# Patient Record
Sex: Female | Born: 1985
Health system: Southern US, Community
[De-identification: ages and names within clinical notes are randomized; demographics above are authoritative.]

## PROBLEM LIST (undated history)

## (undated) ENCOUNTER — Inpatient Hospital Stay (HOSPITAL_COMMUNITY): Payer: Self-pay

## (undated) DIAGNOSIS — I10 Essential (primary) hypertension: Secondary | ICD-10-CM

## (undated) DIAGNOSIS — O139 Gestational [pregnancy-induced] hypertension without significant proteinuria, unspecified trimester: Secondary | ICD-10-CM

---

## 2010-05-17 DIAGNOSIS — O1490 Unspecified pre-eclampsia, unspecified trimester: Secondary | ICD-10-CM

## 2010-05-17 DIAGNOSIS — O4100X0 Oligohydramnios, unspecified trimester, not applicable or unspecified: Secondary | ICD-10-CM

## 2013-07-04 DIAGNOSIS — O1490 Unspecified pre-eclampsia, unspecified trimester: Secondary | ICD-10-CM

## 2016-07-01 ENCOUNTER — Ambulatory Visit (INDEPENDENT_AMBULATORY_CARE_PROVIDER_SITE_OTHER): Payer: BLUE CROSS/BLUE SHIELD | Admitting: Physician Assistant

## 2016-07-01 VITALS — BP 120/78 | HR 91 | Temp 98.4°F | Resp 17 | Ht 64.0 in | Wt 172.0 lb

## 2016-07-01 DIAGNOSIS — R197 Diarrhea, unspecified: Secondary | ICD-10-CM | POA: Diagnosis not present

## 2016-07-01 DIAGNOSIS — R21 Rash and other nonspecific skin eruption: Secondary | ICD-10-CM | POA: Diagnosis not present

## 2016-07-01 LAB — POCT URINALYSIS DIP (MANUAL ENTRY)
Bilirubin, UA: NEGATIVE
Blood, UA: NEGATIVE
Glucose, UA: NEGATIVE mg/dL
Ketones, POC UA: NEGATIVE mg/dL
Leukocytes, UA: NEGATIVE
Nitrite, UA: NEGATIVE
Protein Ur, POC: NEGATIVE mg/dL
Spec Grav, UA: >=1.03 — AB (ref 1.010–1.025)
Urobilinogen, UA: 0.2 E.U./dL
pH, UA: 5.5 (ref 5.0–8.0)

## 2016-07-01 LAB — POCT CBC
Granulocyte percent: 47.5 % (ref 37–80)
HCT, POC: 39.6 % (ref 37.7–47.9)
Hemoglobin: 13.2 g/dL (ref 12.2–16.2)
Lymph, poc: 2.9 (ref 0.6–3.4)
MCH, POC: 27.6 pg (ref 27–31.2)
MCHC: 33.2 g/dL (ref 31.8–35.4)
MCV: 83 fL (ref 80–97)
MID (cbc): 0.6 (ref 0–0.9)
MPV: 7.6 fL (ref 0–99.8)
POC Granulocyte: 3.2 (ref 2–6.9)
POC LYMPH PERCENT: 43.3 % (ref 10–50)
POC MID %: 9.2 %M (ref 0–12)
Platelet Count, POC: 368 10*3/uL (ref 142–424)
RBC: 4.77 M/uL (ref 4.04–5.48)
RDW, POC: 13.2 %
WBC: 6.8 10*3/uL (ref 4.6–10.2)

## 2016-07-01 LAB — POC MICROSCOPIC URINALYSIS (UMFC): Mucus: ABSENT

## 2016-07-01 LAB — POCT URINE PREGNANCY: Preg Test, Ur: NEGATIVE

## 2016-07-01 NOTE — Patient Instructions (Addendum)
It appears from your urine results that you are dehydrated. I recommend drinking at least 64 oz of water daily and perhaps even more since you are having diarrhea. While we are waiting your stool culture results which can take anyone from 2-3 days, I would like you to stick to a bland diet, avoiding bread. You can also try over the counter imodium for your diarrhea. Once we have your results we will contact you. If anything gets worse or does not resolve in 3-4 days, please return. You may need further evaluation by a GI doctor if these symptoms persist as you may have underlying food allergies causing  your symptoms.   Bland Diet A bland diet consists of foods that do not have a lot of fat or fiber. Foods without fat or fiber are easier for the body to digest. They are also less likely to irritate your mouth, throat, stomach, and other parts of your gastrointestinal tract. A bland diet is sometimes called a BRAT diet. What is my plan? Your health care provider or dietitian may recommend specific changes to your diet to prevent and treat your symptoms, such as:  Eating small meals often.  Cooking food until it is soft enough to chew easily.  Chewing your food well.  Drinking fluids slowly.  Not eating foods that are very spicy, sour, or fatty.  Not eating citrus fruits, such as oranges and grapefruit. What do I need to know about this diet?  Eat a variety of foods from the bland diet food list.  Do not follow a bland diet longer than you have to.  Ask your health care provider whether you should take vitamins. What foods can I eat? Grains   Hot cereals, such as cream of wheat. Bread, crackers, or tortillas made from refined white flour. Rice. Vegetables  Canned or cooked vegetables. Mashed or boiled potatoes. Fruits  Bananas. Applesauce. Other types of cooked or canned fruit with the skin and seeds removed, such as canned peaches or pears. Meats and Other Protein Sources  Scrambled  eggs. Creamy peanut butter or other nut butters. Lean, well-cooked meats, such as chicken or fish. Tofu. Soups or broths. Dairy  Low-fat dairy products, such as milk, cottage cheese, or yogurt. Beverages  Water. Herbal tea. Apple juice. Sweets and Desserts  Pudding. Custard. Fruit gelatin. Ice cream. Fats and Oils  Mild salad dressings. Canola or olive oil. The items listed above may not be a complete list of allowed foods or beverages. Contact your dietitian for more options.  What foods are not recommended? Foods and ingredients that are often not recommended include:  Spicy foods, such as hot sauce or salsa.  Fried foods.  Sour foods, such as pickled or fermented foods.  Raw vegetables or fruits, especially citrus or berries.  Caffeinated drinks.  Alcohol.  Strongly flavored seasonings or condiments. The items listed above may not be a complete list of foods and beverages that are not allowed. Contact your dietitian for more information.  This information is not intended to replace advice given to you by your health care provider. Make sure you discuss any questions you have with your health care provider. Document Released: 06/26/2015 Document Revised: 08/10/2015 Document Reviewed: 03/16/2014 Elsevier Interactive Patient Education  2017 Elsevier Inc.  Gluten-Free Diet  The gluten-free diet includes all foods that do not contain gluten. Gluten is a protein that is found in wheat, rye, barley, and some other grains. Following the gluten-free diet is the only treatment for  people with celiac disease. It helps to prevent damage to the intestines and improves or eliminates the symptoms of celiac disease. Following the gluten-free diet requires some planning. It can be challenging at first, but it gets easier with time and practice. There are more gluten-free options available today than ever before. If you need help finding gluten-free foods or if you have questions, talk with your  diet and nutrition specialist (registered dietitian) or your health care provider. What do I need to know about a gluten-free diet?  All fruits, vegetables, and meats are safe to eat and do not contain gluten.  When grocery shopping, start by shopping in the produce, meat, and dairy sections. These sections are more likely to contain gluten-free foods. Then move to the aisles that contain packaged foods if you need to.  Read all food labels. Gluten is often added to foods. Always check the ingredient list and look for warnings, such as "may contain gluten."  Talk with your dietitian or health care provider before taking a gluten-free multivitamin or mineral supplement.  Be aware of gluten-free foods having contact with foods that contain gluten (cross-contamination). This can happen at home and with any processed foods.  Talk with your health care provider or dietitian about how to reduce the risk of cross-contamination in your home.  If you have questions about how a food is processed, ask the manufacturer. What key words help to identify gluten? Foods that list any of these key words on the label usually contain gluten:  Wheat, flour, enriched flour, bromated flour, white flour, durum flour, graham flour, phosphated flour, self-rising flour, semolina, farina, barley (malt), rye, and oats.  Starch, dextrin, modified food starch, or cereal.  Thickening, fillers, or emulsifiers.  Malt flavoring, malt extract, or malt syrup.  Hydrolyzed vegetable protein. In the [place] packaged foods that are gluten-free are required to be labeled "GF." These foods should be easy to identify and are safe to eat. In the [place] food companies are also required to list common food allergens, including wheat, on their labels. Recommended foods Grains   Amaranth, bean flours, 100% buckwheat flour, corn, millet, nut flours or nut meals, GF oats, quinoa, rice, sorghum, teff, rice wafers, pure cornmeal tortillas,  popcorn, and hot cereals made from cornmeal. Hominy, rice, wild rice. Some Asian rice noodles or bean noodles. Arrowroot starch, corn bran, corn flour, corn germ, cornmeal, corn starch, potato flour, potato starch flour, and rice bran. Plain, brown, and sweet rice flours. Rice polish, soy flour, and tapioca starch. Vegetables   All plain fresh, frozen, and canned vegetables. Fruits   All plain fresh, frozen, canned, and dried fruits, and 100% fruit juices. Meats and other protein foods   All fresh beef, pork, poultry, fish, seafood, and eggs. Fish canned in water, oil, brine, or vegetable broth. Plain nuts and seeds, peanut butter. Some lunch meat and some frankfurters. Dried beans, dried peas, and lentils. Dairy   Fresh plain, dry, evaporated, or condensed milk. Cream, butter, sour cream, whipping cream, and most yogurts. Unprocessed cheese, most processed cheeses, some cottage cheese, some cream cheeses. Beverages   Coffee, tea, most herbal teas. Carbonated beverages and some root beers. Wine, sake, and distilled spirits, such as gin, vodka, and whiskey. Most hard ciders. Fats and oils   Butter, margarine, vegetable oil, hydrogenated butter, olive oil, shortening, lard, cream, and some mayonnaise. Some commercial salad dressings. Olives. Sweets and desserts   Sugar, honey, some syrups, molasses, jelly, and jam. Plain hard candy,  marshmallows, and gumdrops. Pure cocoa powder. Plain chocolate. Custard and some pudding mixes. Gelatin desserts, sorbets, frozen ice pops, and sherbet. Cake, cookies, and other desserts prepared with allowed flours. Some commercial ice creams. Cornstarch, tapioca, and rice puddings. Seasoning and other foods   Some canned or frozen soups. Monosodium glutamate (MSG). Cider, rice, and wine vinegar. Baking soda and baking powder. Cream of tartar. Baking and nutritional yeast. Certain soy sauces made without wheat (ask your dietitian about specific brands that are  allowed). Nuts, coconut, and chocolate. Salt, pepper, herbs, spices, flavoring extracts, imitation or artificial flavorings, natural flavorings, and food colorings. Some medicines and supplements. Some lip glosses and other cosmetics. Rice syrups. The items listed may not be a complete list. Talk with your dietitian about what dietary choices are best for you. Foods to avoid Grains   Barley, bran, bulgur, couscous, cracked wheat, [place], farro, graham, malt, matzo, semolina, wheat germ, and all wheat and rye cereals including spelt and kamut. Cereals containing malt as a flavoring, such as rice cereal. Noodles, spaghetti, macaroni, most packaged rice mixes, and all mixes containing wheat, rye, barley, or triticale. Vegetables   Most creamed vegetables and most vegetables canned in sauces. Some commercially prepared vegetables and salads. Fruits   Thickened or prepared fruits and some pie fillings. Some fruit snacks and fruit roll-ups. Meats and other protein foods   Any meat or meat alternative containing wheat, rye, barley, or gluten stabilizers. These are often marinated or packaged meats and lunch meats. Bread-containing products, such as Swiss steak, croquettes, meatballs, and meatloaf. Most tuna canned in vegetable broth and [place] with hydrolyzed vegetable protein (HVP) injected as part of the basting. Seitan. Imitation fish. Eggs in sauces made from ingredients to avoid. Dairy   Commercial chocolate milk drinks and malted milk. Some non-dairy creamers. Any cheese product containing ingredients to avoid. Beverages   Certain cereal beverages. Beer, ale, malted milk, and some root beers. Some hard ciders. Some instant flavored coffees. Some herbal teas made with barley or with barley malt added. Fats and oils   Some commercial salad dressings. Sour cream containing modified food starch. Sweets and desserts   Some toffees. Chocolate-coated nuts (may be rolled in wheat flour) and some  commercial candies and candy bars. Most cakes, cookies, donuts, pastries, and other baked goods. Some commercial ice cream. Ice cream cones. Commercially prepared mixes for cakes, cookies, and other desserts. Bread pudding and other puddings thickened with flour. Products containing brown rice syrup made with barley malt enzyme. Desserts and sweets made with malt flavoring. Seasoning and other foods   Some curry powders, some dry seasoning mixes, some gravy extracts, some meat sauces, some ketchups, some prepared mustards, and horseradish. Certain soy sauces. Malt vinegar. Bouillon and bouillon cubes that contain HVP. Some chip dips, and some chewing gum. Yeast extract. Brewer's yeast. Caramel color. Some medicines and supplements. Some lip glosses and other cosmetics. The items listed may not be a complete list. Talk with your dietitian about what dietary choices are best for you. Summary  Gluten is a protein that is found in wheat, rye, barley, and some other grains. The gluten-free diet includes all foods that do not contain gluten.  If you need help finding gluten-free foods or if you have questions, talk with your diet and nutrition specialist (registered dietitian) or your health care provider.  Read all food labels. Gluten is often added to foods. Always check the ingredient list and look for warnings, such as "may contain gluten."  This information is not intended to replace advice given to you by your health care provider. Make sure you discuss any questions you have with your health care provider. Document Released: 03/04/2005 Document Revised: 12/18/2015 Document Reviewed: 12/18/2015 Elsevier Interactive Patient Education  2017 [employer].     IF you received an x-ray today, you will receive an invoice from [facility]. Please contact [facility] at [phone] with questions or concerns regarding your invoice.   IF you received labwork today, you will receive an  invoice from [facility]. Please contact [facility] at [phone] with questions or concerns regarding your invoice.   Our billing staff will not be able to assist you with questions regarding bills from these companies.  You will be contacted with the lab results as soon as they are available. The fastest way to get your results is to activate your My Chart account. Instructions are located on the last page of this paperwork. If you have not heard from [place] regarding the results in 2 weeks, please contact this office.

## 2016-07-01 NOTE — Progress Notes (Signed)
Katara Griner  MRN: [id number] DOB: 11/22/1985  Subjective:  Meagan Lee is a 31 y.o. female seen in office today for a chief complaint of diarrhea x 10 days. Has anywhere from 1-5 episodes a day. Had two episodes of vomiting yesterday. She has also been getting a skin rash that looks like hives all over her body after she eats bread for the past 10 days. It resolves in about 30 minutes. This has never happened in the past.Denies bloody, mucopurulent stools, abdominal pain, constipation, fever, and chills. Has not tried anything for relief.  Has not been around anyone that has been sick. No recent travel. Has been able to eat and drink over the past 10 days. LMP 06/02/16. Denies smoking and alcohol use.   Review of Systems  Constitutional: Negative for diaphoresis and fatigue.  Eyes: Negative for visual disturbance.  Genitourinary: Negative for dysuria, flank pain, pelvic pain, urgency, vaginal discharge and vaginal pain.  Musculoskeletal: Negative for gait problem.  Allergic/Immunologic: Negative for environmental allergies and food allergies.  Neurological: Positive for headaches. Negative for dizziness, weakness and light-headedness.    There are no active problems to display for this patient.   No current outpatient prescriptions on file prior to visit.   No current facility-administered medications on file prior to visit.     No Known Allergies   Objective:  BP 120/78 (BP Location: Right Arm, Patient Position: Sitting, Cuff Size: Normal)   Pulse 91   Temp 98.4 F (36.9 C) (Oral)   Resp 17   Ht _0  (1.626 m)   Wt 172 lb (78 kg)   LMP 06/02/2016   SpO2 99%   BMI 29.52 kg/m   Physical Exam  Constitutional: She is oriented to person, place, and time and well-developed, well-nourished, and in no distress.  HENT:  Head: Normocephalic and atraumatic.  Mouth/Throat: Uvula is midline, oropharynx is clear and moist and mucous membranes are normal.  Eyes: Conjunctivae are  normal. Pupils are equal, round, and reactive to light.  Neck: Normal range of motion.  Cardiovascular: Normal rate, regular rhythm and normal heart sounds.   Pulmonary/Chest: Effort normal.  Abdominal: Soft. Bowel sounds are normal. She exhibits no distension. There is no tenderness. There is no guarding.  Neurological: She is alert and oriented to person, place, and time. She has normal sensation, normal strength, normal reflexes and intact cranial nerves. Gait normal.  Skin: Skin is warm and dry. No rash noted.  Psychiatric: Affect normal.  Vitals reviewed.  Results for orders placed or performed in visit on 07/01/16 (from the past 24 hour(s))  POCT CBC     Status: None   Collection Time: 07/01/16  4:33 PM  Result Value Ref Range   WBC 6.8 4.6 - 10.2 K/uL   Lymph, poc 2.9 0.6 - 3.4   POC LYMPH PERCENT 43.3 10 - 50 %L   MID (cbc) 0.6 0 - 0.9   POC MID % 9.2 0 - 12 %M   POC Granulocyte 3.2 2 - 6.9   Granulocyte percent 47.5 37 - 80 %G   RBC 4.77 4.04 - 5.48 M/uL   Hemoglobin 13.2 12.2 - 16.2 g/dL   HCT, POC 39.6 37.7 - 47.9 %   MCV 83.0 80 - 97 fL   MCH, POC 27.6 27 - 31.2 pg   MCHC 33.2 31.8 - 35.4 g/dL   RDW, POC 13.2 %   Platelet Count, POC 368 142 - 424 K/uL   MPV 7.6 0 -  99.8 fL  POCT urinalysis dipstick     Status: Abnormal   Collection Time: 07/01/16  4:39 PM  Result Value Ref Range   Color, UA yellow yellow   Clarity, UA cloudy (A) clear   Glucose, UA negative negative mg/dL   Bilirubin, UA negative negative   Ketones, POC UA negative negative mg/dL   Spec Grav, UA >=1.030 (A) 1.010 - 1.025   Blood, UA negative negative   pH, UA 5.5 5.0 - 8.0   Protein Ur, POC negative negative mg/dL   Urobilinogen, UA 0.2 0.2 or 1.0 E.U./dL   Nitrite, UA Negative Negative   Leukocytes, UA Negative Negative  POCT urine pregnancy     Status: None   Collection Time: 07/01/16  4:40 PM  Result Value Ref Range   Preg Test, Ur Negative Negative    Assessment and Plan :  1.  Diarrhea, unspecified type Labs pending. Recommended bland diet and avoiding all gluten as her history is suspicious for a gluten allergy. Also instructed to continue oral hydration as her urine suggests she is slightly dehydrated. Recommended oral hydration with at least 64 oz of fluids daily. Instructed to -Return to clinic if symptoms worsen, do not improve in 3-4, or as needed. If pt returns with same symptoms, consider referral to GI.  - POCT CBC - POCT urinalysis dipstick - POCT Microscopic Urinalysis ([facility]) - POCT urine pregnancy - CMP14+EGFR - Stool culture 2. Rash and nonspecific skin eruption Not present on exam today. Avoid gluten as this appears to be the offending agent. Return if symptoms persist.    Tenna Delaine PA-C  [facility] 07/01/2016 4:45 PM

## 2016-07-02 DIAGNOSIS — R197 Diarrhea, unspecified: Secondary | ICD-10-CM | POA: Diagnosis not present

## 2016-07-02 LAB — CMP14+EGFR
ALT: 23 [IU]/L (ref 0–32)
AST: 23 IU/L (ref 0–40)
Albumin/Globulin Ratio: 1.5 (ref 1.2–2.2)
Albumin: 4.5 g/dL (ref 3.5–5.5)
Alkaline Phosphatase: 60 IU/L (ref 39–117)
BUN/Creatinine Ratio: 33 — ABNORMAL HIGH (ref 9–23)
BUN: 16 mg/dL (ref 6–20)
Bilirubin Total: 0.3 mg/dL (ref 0.0–1.2)
CO2: 23 mmol/L (ref 18–29)
Calcium: 9.7 mg/dL (ref 8.7–10.2)
Chloride: 101 mmol/L (ref 96–106)
Creatinine, Ser: 0.49 mg/dL — ABNORMAL LOW (ref 0.57–1.00)
GFR calc Af Amer: 150 mL/min/{1.73_m2} (ref >59)
GFR calc non Af Amer: 130 mL/min/{1.73_m2} (ref >59)
Globulin, Total: 3 g/dL (ref 1.5–4.5)
Glucose: 95 mg/dL (ref 65–99)
Potassium: 4.5 mmol/L (ref 3.5–5.2)
Sodium: 140 mmol/L (ref 134–144)
Total Protein: 7.5 g/dL (ref 6.0–8.5)

## 2016-07-03 ENCOUNTER — Telehealth: Payer: Self-pay | Admitting: General Practice

## 2016-07-03 NOTE — Telephone Encounter (Signed)
Pt is looking for lab results   Best number [phone]

## 2016-07-05 NOTE — Telephone Encounter (Signed)
Will you please call patient and let her know that I was waiting for all of her labs to result before I call and discuss them with her. Her stool culture results are still pending. So far her electrolytes, blood sugar, and liver enzymes were all clinically normal. Her kidney function was slightly lower than normal but that is likely due to her dehydrated state. We can repeat this lab in 4-6 weeks. Make sure she is still consuming at least 64 oz of water a day. If her symptoms are still occurring, please return to office visit sooner for further evaluation.

## 2016-07-05 NOTE — Telephone Encounter (Signed)
Please see abn result and advise

## 2016-07-06 LAB — STOOL CULTURE: E coli, Shiga toxin Assay: NEGATIVE

## 2016-07-06 NOTE — Telephone Encounter (Signed)
Pt advised.

## 2017-01-02 ENCOUNTER — Encounter: Payer: Self-pay | Admitting: Family Medicine

## 2017-01-02 ENCOUNTER — Ambulatory Visit (INDEPENDENT_AMBULATORY_CARE_PROVIDER_SITE_OTHER): Payer: BLUE CROSS/BLUE SHIELD | Admitting: Family Medicine

## 2017-01-02 VITALS — BP 110/80 | HR 91 | Temp 98.3°F | Resp 18 | Ht 64.25 in | Wt 169.6 lb

## 2017-01-02 DIAGNOSIS — N644 Mastodynia: Secondary | ICD-10-CM

## 2017-01-02 NOTE — Progress Notes (Signed)
  Chief Complaint  Patient presents with  . right breast pain    x 1 month, not taking any otc medication for pain.  Pain level 5/10    HPI   Breast pain on the right for one month She has a history of pain like this 10 years ago  She reports that the pain is at the top of her breast below the clavicle She does not drink a lot of caffeine Patient's last menstrual period was 11/25/2016. She denies possibility of pregnancy She denies any trauma to the breast There is no nipple discharge  History reviewed. No pertinent past medical history.  No current outpatient prescriptions on file.   No current facility-administered medications for this visit.     Allergies: No Known Allergies  Past Surgical History:  Procedure Laterality Date  . CESAREAN SECTION      Social History   Social History  . Marital status: Married    Spouse name: N/A  . Number of children: N/A  . Years of education: N/A   Social History Main Topics  . Smoking status: Never Smoker  . Smokeless tobacco: Never Used  . Alcohol use No  . Drug use: No  . Sexual activity: Not Asked   Other Topics Concern  . None   Social History Narrative  . None    Review of Systems  Constitutional: Negative for chills, fever, malaise/fatigue and weight loss.  Cardiovascular: Negative for chest pain and palpitations.  Musculoskeletal: Negative for joint pain and neck pain.  Skin: Negative for itching and rash.  Endo/Heme/Allergies: Does not bruise/bleed easily.  Psychiatric/Behavioral: The patient is not nervous/anxious.     Objective: Vitals:   01/02/17 1331  BP: 110/80  Pulse: 91  Resp: 18  Temp: 98.3 F (36.8 C)  TempSrc: Oral  SpO2: 97%  Weight: 169 lb 9.6 oz (76.9 kg)  Height: 5' 4.25" (1.632 m)    Physical Exam  Constitutional: She appears well-developed and well-nourished.  Eyes: Conjunctivae and EOM are normal.  Cardiovascular: Normal rate, regular rhythm and normal heart sounds.     Pulmonary/Chest: Effort normal and breath sounds normal. No respiratory distress. She has no wheezes. Right breast exhibits no inverted nipple, no mass, no nipple discharge, no skin change and no tenderness. Left breast exhibits no inverted nipple, no mass, no nipple discharge, no skin change and no tenderness. Breasts are symmetrical. There is no breast swelling.    Genitourinary: No breast tenderness, discharge or bleeding.    Chaperone present for breast exam  Assessment and Plan Cindee was seen today for right breast pain.  Diagnoses and all orders for this visit:  Breast pain, right -     MM Digital Diagnostic Unilat R; Future Will send for breast exam with Ultrasound    Courtni Balash A Creta LevinStallings

## 2017-01-02 NOTE — Patient Instructions (Addendum)
     IF you received an x-ray today, you will receive an invoice from [facility]. Please contact [facility] at [phone] with questions or concerns regarding your invoice.   IF you received labwork today, you will receive an invoice from [facility]. Please contact [facility] at [phone] with questions or concerns regarding your invoice.   Our billing staff will not be able to assist you with questions regarding bills from these companies.  You will be contacted with the lab results as soon as they are available. The fastest way to get your results is to activate your My Chart account. Instructions are located on the last page of this paperwork. If you have not heard from us regarding the results in 2 weeks, please contact this office.     Mammogram A mammogram is an X-ray of the breasts that is done to check for changes that are not normal. This test can screen for and find any changes that may suggest breast cancer. This test can also help to find other changes and variations in the breast. What happens before the procedure?  Have this test done about 1-2 weeks after your period. This is usually when your breasts are the least tender.  If you are visiting a new doctor or clinic, send any past mammogram images to your new doctor's office.  Wash your breasts and under your arms the day of the test.  Do not use deodorants, perfumes, lotions, or powders on the day of the test.  Take off any jewelry from your neck.  Wear clothes that you can change into and out of easily. What happens during the procedure?  You will undress from the waist up. You will put on a gown.  You will stand in front of the X-ray machine.  Each breast will be placed between two plastic or glass plates. The plates will press down on your breast for a few seconds. Try to stay as relaxed as possible. This does not cause any harm to your breasts. Any discomfort you feel will be very  brief.  X-rays will be taken from different angles of each breast. The procedure may vary among doctors and hospitals. What happens after the procedure?  The mammogram will be looked at by a specialist (radiologist).  You may need to do certain parts of the test again. This depends on the quality of the images.  Ask when your test results will be ready. Make sure you get your test results.  You may go back to your normal activities. This information is not intended to replace advice given to you by your health care provider. Make sure you discuss any questions you have with your health care provider. Document Released: 05/31/2008 Document Revised: 08/10/2015 Document Reviewed: 05/13/2014 Elsevier Interactive Patient Education  [employer]2018 Else[employer] Inc.

## 2017-01-24 DIAGNOSIS — Z30431 Encounter for routine checking of intrauterine contraceptive device: Secondary | ICD-10-CM | POA: Diagnosis not present

## 2017-02-17 DIAGNOSIS — Z113 Encounter for screening for infections with a predominantly sexual mode of transmission: Secondary | ICD-10-CM | POA: Diagnosis not present

## 2017-02-17 DIAGNOSIS — Z30431 Encounter for routine checking of intrauterine contraceptive device: Secondary | ICD-10-CM | POA: Diagnosis not present

## 2017-02-17 DIAGNOSIS — Z30011 Encounter for initial prescription of contraceptive pills: Secondary | ICD-10-CM | POA: Diagnosis not present

## 2017-03-25 ENCOUNTER — Encounter (HOSPITAL_COMMUNITY): Payer: Self-pay | Admitting: Emergency Medicine

## 2017-03-25 ENCOUNTER — Emergency Department (HOSPITAL_COMMUNITY)
Admission: EM | Admit: 2017-03-25 | Discharge: 2017-03-25 | Disposition: A | Discharge status: Home / Self Care | Payer: BLUE CROSS/BLUE SHIELD | Attending: Emergency Medicine | Admitting: Emergency Medicine

## 2017-03-25 ENCOUNTER — Emergency Department (HOSPITAL_COMMUNITY): Payer: BLUE CROSS/BLUE SHIELD

## 2017-03-25 DIAGNOSIS — J189 Pneumonia, unspecified organism: Secondary | ICD-10-CM | POA: Diagnosis not present

## 2017-03-25 DIAGNOSIS — R1084 Generalized abdominal pain: Secondary | ICD-10-CM | POA: Diagnosis not present

## 2017-03-25 DIAGNOSIS — J181 Lobar pneumonia, unspecified organism: Secondary | ICD-10-CM | POA: Insufficient documentation

## 2017-03-25 DIAGNOSIS — R109 Unspecified abdominal pain: Secondary | ICD-10-CM | POA: Diagnosis not present

## 2017-03-25 DIAGNOSIS — R110 Nausea: Secondary | ICD-10-CM | POA: Insufficient documentation

## 2017-03-25 LAB — URINALYSIS, ROUTINE W REFLEX MICROSCOPIC
Bacteria, UA: NONE SEEN
Bilirubin Urine: NEGATIVE
Glucose, UA: NEGATIVE mg/dL
Ketones, ur: NEGATIVE mg/dL
Leukocytes, UA: NEGATIVE
Nitrite: NEGATIVE
Protein, ur: NEGATIVE mg/dL
Specific Gravity, Urine: 1.012 (ref 1.005–1.030)
pH: 5 (ref 5.0–8.0)

## 2017-03-25 LAB — CBC WITH DIFFERENTIAL/PLATELET
Basophils Absolute: 0 10*3/uL (ref 0.0–0.1)
Basophils Relative: 0 %
Eosinophils Absolute: 0.3 10*3/uL (ref 0.0–0.7)
Eosinophils Relative: 3 %
HCT: 38.6 % (ref 36.0–46.0)
Hemoglobin: 12.6 g/dL (ref 12.0–15.0)
Lymphocytes Relative: 41 %
Lymphs Abs: 3.6 10*3/uL (ref 0.7–4.0)
MCH: 27.6 pg (ref 26.0–34.0)
MCHC: 32.6 g/dL (ref 30.0–36.0)
MCV: 84.6 fL (ref 78.0–100.0)
Monocytes Absolute: 0.8 10*3/uL (ref 0.1–1.0)
Monocytes Relative: 9 %
Neutro Abs: 4.1 10*3/uL (ref 1.7–7.7)
Neutrophils Relative %: 47 %
Platelets: 301 10*3/uL (ref 150–400)
RBC: 4.56 MIL/uL (ref 3.87–5.11)
RDW: 12.9 % (ref 11.5–15.5)
WBC: 8.7 10*3/uL (ref 4.0–10.5)

## 2017-03-25 LAB — COMPREHENSIVE METABOLIC PANEL
ALT: 20 U/L (ref 14–54)
AST: 21 U/L (ref 15–41)
Albumin: 3.7 g/dL (ref 3.5–5.0)
Alkaline Phosphatase: 56 U/L (ref 38–126)
Anion gap: 9 (ref 5–15)
BUN: 13 mg/dL (ref 6–20)
CO2: 20 mmol/L — ABNORMAL LOW (ref 22–32)
Calcium: 9.2 mg/dL (ref 8.9–10.3)
Chloride: 107 mmol/L (ref 101–111)
Creatinine, Ser: 0.49 mg/dL (ref 0.44–1.00)
GFR calc Af Amer: >60 mL/min (ref >60)
GFR calc non Af Amer: >60 mL/min (ref >60)
Glucose, Bld: 112 mg/dL — ABNORMAL HIGH (ref 65–99)
Potassium: 3.8 mmol/L (ref 3.5–5.1)
Sodium: 136 mmol/L (ref 135–145)
Total Bilirubin: 0.9 mg/dL (ref 0.3–1.2)
Total Protein: 7.2 g/dL (ref 6.5–8.1)

## 2017-03-25 LAB — I-STAT BETA HCG BLOOD, ED (MC, WL, AP ONLY): I-stat hCG, quantitative: <5 m[IU]/mL (ref <5)

## 2017-03-25 MED ORDER — MORPHINE SULFATE (PF) 4 MG/ML IV SOLN
4.0000 mg | Freq: Once | INTRAVENOUS | Status: AC
  Administered 2017-03-25: 4 mg via INTRAVENOUS
  Filled 2017-03-25: qty 1

## 2017-03-25 MED ORDER — KETOROLAC TROMETHAMINE 30 MG/ML IJ SOLN
30.0000 mg | Freq: Once | INTRAMUSCULAR | Status: AC
  Administered 2017-03-25: 30 mg via INTRAVENOUS
  Filled 2017-03-25: qty 1

## 2017-03-25 MED ORDER — ONDANSETRON HCL 4 MG/2ML IJ SOLN
4.0000 mg | Freq: Once | INTRAMUSCULAR | Status: AC
  Administered 2017-03-25: 4 mg via INTRAVENOUS
  Filled 2017-03-25: qty 2

## 2017-03-25 MED ORDER — OXYCODONE-ACETAMINOPHEN 5-325 MG PO TABS
1.0000 | ORAL_TABLET | Freq: Once | ORAL | Status: AC
  Administered 2017-03-25: 1 via ORAL
  Filled 2017-03-25: qty 1

## 2017-03-25 MED ORDER — AZITHROMYCIN 250 MG PO TABS
500.0000 mg | ORAL_TABLET | Freq: Once | ORAL | Status: AC
  Administered 2017-03-25: 500 mg via ORAL
  Filled 2017-03-25: qty 2

## 2017-03-25 MED ORDER — AZITHROMYCIN 250 MG PO TABS: 250.0000 mg | ORAL_TABLET | Freq: Every day | ORAL | 0 refills | Status: AC

## 2017-03-25 NOTE — ED Provider Notes (Signed)
[facility] Provider Note   CSN: [id number] Arrival date & time: 03/25/17  0051     History   Chief Complaint Chief Complaint  Patient presents with  . Flank Pain    HPI Meagan Lee is a 32 y.o. female presents for left flank pain since last night. Described as sharp, nonradiating, constant. Worse with and palpation. Has taken ibuprofen with mild relief that lasted 2 hours. Associated symptoms include nausea. Denies fevers, chills, chest pain, shortness of breath, abdominal pain, dysuria, hematuria, abnormal vaginal discharge. Last menstrual period began yesterday. No trauma, falls or exertional activity preceding symptoms. No history of kidney stones. Has been urinating without difficulty.   HPI  History reviewed. No pertinent past medical history.  There are no active problems to display for this patient.   Past Surgical History:  Procedure Laterality Date  . CESAREAN SECTION      OB History    No data available       Home Medications    Prior to Admission medications   Medication Sig Start Date End Date Taking? Authorizing Provider  ibuprofen (ADVIL,MOTRIN) 200 MG tablet Take 200 mg by mouth every 6 (six) hours as needed for moderate pain.   Yes [provider]  azithromycin (ZITHROMAX Z-PAK) 250 MG tablet Take 1 tablet (250 mg total) by mouth daily for 4 doses. 03/25/17 03/29/17  Liberty HandyGibbons, Nico Syme J, PA-C    Family History Family History  Problem Relation Age of Onset  . Hypertension Maternal Grandmother   . Asthma Maternal Grandfather     Social History Social History   Tobacco Use  . Smoking status: Never Smoker  . Smokeless tobacco: Never Used  Substance Use Topics  . Alcohol use: No  . Drug use: No     Allergies   Patient has no known allergies.   Review of Systems Review of Systems  Gastrointestinal: Positive for nausea.  Genitourinary: Positive for flank pain.  All other systems reviewed and  are negative.    Physical Exam Updated Vital Signs BP 104/60   Pulse 80   Temp 97.7 F (36.5 C) (Oral)   Resp 18   LMP 03/24/2017   SpO2 97%   Physical Exam  Constitutional: She is oriented to person, place, and time. She appears well-developed and well-nourished. No distress.  NAD. Nontoxic, looks uncomfortable.  HENT:  Head: Normocephalic and atraumatic.  Right Ear: External ear normal.  Left Ear: External ear normal.  Nose: Nose normal.  Moist mucous membranes.  Eyes: Conjunctivae and EOM are normal. No scleral icterus.  Neck: Normal range of motion.  Cardiovascular: Normal rate, regular rhythm and normal heart sounds.  No murmur heard. Pulmonary/Chest: Effort normal and breath sounds normal. She has no wheezes.  Abdominal: Soft. Bowel sounds are normal. There is tenderness.  Right CVA tenderness. No overlying rashes or ecchymosis. No midline CTL spine tenderness. Abdomen otherwise soft, nontender, nondistended. No suprapubic tenderness.  Musculoskeletal: Normal range of motion. She exhibits no deformity.  Neurological: She is alert and oriented to person, place, and time.  Skin: Skin is warm and dry. Capillary refill takes less than 2 seconds.  Psychiatric: She has a normal mood and affect. Her behavior is normal. Judgment and thought content normal.  Nursing note and vitals reviewed.    ED Treatments / Results  Labs (all labs ordered are listed, but only abnormal results are displayed) Labs Reviewed  COMPREHENSIVE METABOLIC PANEL - Abnormal; Notable for the following components:  Result Value   CO2 20 (*)    Glucose, Bld 112 (*)    All other components within normal limits  URINALYSIS, ROUTINE W REFLEX MICROSCOPIC - Abnormal; Notable for the following components:   Hgb urine dipstick MODERATE (*)    Squamous Epithelial / LPF 0-5 (*)    All other components within normal limits  URINE CULTURE  CBC WITH DIFFERENTIAL/PLATELET  I-STAT BETA HCG BLOOD, ED ([facility],  [facility], [facility] ONLY)    EKG  EKG Interpretation None       Radiology Dg Chest 2 View  Result Date: 03/25/2017 CLINICAL DATA:  Flank pain since yesterday. EXAM: CHEST  2 VIEW COMPARISON:  CT scan 03/25/2017 FINDINGS: The cardiac silhouette, mediastinal and hilar contours are normal. Streaky subsegmental bibasilar atelectasis but no infiltrates, edema or effusions. The bony thorax is intact. IMPRESSION: Streaky bibasilar atelectasis but no infiltrates or effusions. Electronically Signed   By: Rudie Meyer M.D.   On: 03/25/2017 11:35   Ct Renal Stone Study  Result Date: 03/25/2017 CLINICAL DATA:  Left flank pain for 3 days EXAM: CT ABDOMEN AND PELVIS WITHOUT CONTRAST TECHNIQUE: Multidetector CT imaging of the abdomen and pelvis was performed following the standard protocol without oral or intravenous contrast material administration. COMPARISON:  None. FINDINGS: Lower chest: There is patchy airspace opacity in the posterior lung bases consistent with atelectasis and likely superimposed bibasilar pneumonia. Hepatobiliary: No focal liver lesions are apparent on this noncontrast enhanced study. Gallbladder wall is not appreciably thickened. There is no biliary duct dilatation. Pancreas: There is no apparent mass or inflammatory focus in the pancreas. Spleen: No splenic lesions are evident. A small splenule is noted medial to the spleen somewhat posteriorly. Adrenals/Urinary Tract: Adrenals bilaterally appear normal. Kidneys bilaterally show no evident mass or hydronephrosis on either side. There is no evident renal or ureteral calculus on either side. Urinary bladder is midline with wall thickness within normal limits. Stomach/Bowel: There is stool present throughout much of the cecum and proximal at ascending colon. There is no bowel wall or mesenteric thickening. There is no appreciable bowel obstruction. No free air or portal venous air. Vascular/Lymphatic: There is no abdominal aortic aneurysm. No vascular  lesions are evident. There is a retroaortic left renal vein, an anatomic variant. There is no appreciable adenopathy in the abdomen or pelvis. Reproductive: Uterus is in the midline. No intrauterine mass is appreciable. There is a fat containing structure arising in the left ovary measuring 1.0 x 1.0 cm, a probable small dermoid. No other pelvic mass evident. Other: Appendix appears normal. No abscess or ascites evident in the abdomen or pelvis. There is a rather minimal ventral hernia containing only fat. Musculoskeletal: There is lumbar dextroscoliosis. There are no blastic or lytic bone lesions. There is no intramuscular or abdominal wall lesion. IMPRESSION: 1.  Apparent pneumonia in the posterior lung bases bilaterally. 2.   There is no renal or ureteral calculus.  No hydronephrosis. 3. No bowel wall thickening or bowel obstruction. No evident abscess. Appendix region appears normal. 4.  Small left ovarian dermoid. 5.  Rather minimal ventral hernia containing only fat. Electronically Signed   By: Bretta Bang III M.D.   On: 03/25/2017 10:38    Procedures Procedures (including critical care time)  Medications Ordered in ED Medications  azithromycin (ZITHROMAX) tablet 500 mg (not administered)  oxyCODONE-acetaminophen (PERCOCET/ROXICET) 5-325 MG per tablet 1 tablet (1 tablet Oral Given 03/25/17 0132)  morphine 4 MG/ML injection 4 mg (4 mg Intravenous Given 03/25/17 0947)  ondansetron [facility]) injection 4 mg (4 mg Intravenous Given 03/25/17 0946)  ketorolac (TORADOL) 30 MG/ML injection 30 mg (30 mg Intravenous Given 03/25/17 1116)  morphine 4 MG/ML injection 4 mg (4 mg Intravenous Given 03/25/17 1116)     Initial Impression / Assessment and Plan / ED Course  I have reviewed the triage vital signs and the nursing notes.  Pertinent labs & imaging results that were available during my care of the patient were reviewed by me and considered in my medical decision making (see chart for  details).  Clinical Course as of Mar 25 1212  Tue Mar 25, 2017  1058 IMPRESSION: 1. Apparent pneumonia in the posterior lung bases bilaterally. 2.  There is no renal or ureteral calculus. No hydronephrosis. 3. No bowel wall thickening or bowel obstruction. No evident abscess. Appendix region appears normal. 4. Small left ovarian dermoid. 5. Rather minimal ventral hernia containing only fat. CT Renal Stone Study [CG]  1107 Reevaluated patient. Discussed results of CT renal study. On repeat exam, she still has reproducible left flank pain that goes up into left posterior chest wall. States that she had a cough recently after using Clorox to clean her house but otherwise denies cough, fevers or chest pains.  [CG]  1203 FINDINGS: The cardiac silhouette, mediastinal and hilar contours are normal. Streaky subsegmental bibasilar atelectasis but no infiltrates, edema or effusions. The bony thorax is intact.  [CG]    Clinical Course User Index [CG] Liberty Handy, PA-C   32 year old healthy female presents with sudden onset left flank pain. Worse with movement and palpation.  On exam, nontoxic. Afebrile. She has right CVA tenderness. No abdominal tenderness. No rashes. We'll evaluate for kidney stone. Lower suspicion for diverticulitis, dissection. History and exam not consistent with cholecystitis, appendicitis, POA.  Lab work unremarkable. Moderate hemoglobin and urine however she is currently on her menses. She does not have any dysuria. CT renal stone pending.   Final Clinical Impressions(s) / ED Diagnoses   CT negative for kidney stone, hydronephrosis but noted bilateral infiltrates in the lower lobes bilaterally. This could explain left-sided flank/posterior chest wall pain. Patient has remained afebrile, without hypoxia, tachypnea or tachycardia. She is PERC negative. She is ambulatory without desaturation. No significant immunosuppression. We'll treat for community-acquired  pneumonia with Zithromax.  Final diagnoses:  Community acquired pneumonia of left lower lobe of lung [facility])    ED Discharge Orders        Ordered    azithromycin (ZITHROMAX Z-PAK) 250 MG tablet  Daily     03/25/17 1214       Liberty Handy, [place] 03/25/17 1214    Margarita Grizzle, MD 03/26/17 [phone]

## 2017-03-25 NOTE — ED Notes (Signed)
Patient transported to CT 

## 2017-03-25 NOTE — ED Notes (Signed)
Patient ambulated in the hallway with no complications.  Patients 02 saturation remained at 96% while ambulating.

## 2017-03-25 NOTE — Discharge Instructions (Signed)
CT scan showed pneumonia in both of your lower lungs. There is no kidney stone. Other lab work was normal.  Please take antibiotic as prescribed for the next 4 days. Take Tylenol or ibuprofen for pain.  You can take as much as 1000 mg tylenol every 8 hours. If pain persists, take 600 mg ibuprofen every 8 hours.  Return to ED for shortness of breath, chest pain, abdominal pain, urinary symptoms.  Contact [facility] to establish care with a primary care provider for regular, routine medical care.  This clinic accepts patients without medical insurance. A primary care provider can perform physical exams, adjust your daily medications and give you refills.

## 2017-03-25 NOTE — ED Triage Notes (Signed)
Patient reports left flank pain onset yesterday , denies injury , no hematuria or dysuria .

## 2017-03-26 ENCOUNTER — Emergency Department (HOSPITAL_COMMUNITY)
Admission: EM | Admit: 2017-03-26 | Discharge: 2017-03-26 | Discharge status: Against Medical Advice | Payer: BLUE CROSS/BLUE SHIELD

## 2017-03-26 ENCOUNTER — Ambulatory Visit (HOSPITAL_COMMUNITY)
Admission: EM | Admit: 2017-03-26 | Discharge: 2017-03-26 | Disposition: A | Discharge status: Home / Self Care | Payer: BLUE CROSS/BLUE SHIELD

## 2017-03-26 NOTE — ED Notes (Signed)
Patient complains of left flank pain, firmly denies this is pain from pneumonia.  Discussed what tests available here versus emergency department.  Discussed with dr hagler.  Patient and family decided to go to emergency department

## 2017-03-26 NOTE — ED Notes (Signed)
Unable to locate pt. at waiting area .  

## 2017-03-26 NOTE — ED Notes (Signed)
Pt not answering for triage, presumed to have left without being seen.

## 2017-03-27 ENCOUNTER — Other Ambulatory Visit: Payer: Self-pay

## 2017-03-27 ENCOUNTER — Encounter: Payer: Self-pay | Admitting: Family Medicine

## 2017-03-27 ENCOUNTER — Ambulatory Visit: Payer: BLUE CROSS/BLUE SHIELD | Admitting: Family Medicine

## 2017-03-27 VITALS — BP 110/74 | HR 98 | Temp 97.9°F | Ht 64.17 in | Wt 170.0 lb

## 2017-03-27 DIAGNOSIS — M545 Low back pain, unspecified: Secondary | ICD-10-CM

## 2017-03-27 DIAGNOSIS — R109 Unspecified abdominal pain: Secondary | ICD-10-CM | POA: Diagnosis not present

## 2017-03-27 DIAGNOSIS — K5900 Constipation, unspecified: Secondary | ICD-10-CM

## 2017-03-27 LAB — POCT URINALYSIS DIP (MANUAL ENTRY)
Glucose, UA: NEGATIVE mg/dL
Ketones, POC UA: NEGATIVE mg/dL
Leukocytes, UA: NEGATIVE
Nitrite, UA: NEGATIVE
Spec Grav, UA: >=1.03 — AB (ref 1.010–1.025)
Urobilinogen, UA: 0.2 E.U./dL
pH, UA: 5.5 (ref 5.0–8.0)

## 2017-03-27 LAB — URINE CULTURE: Culture: NO GROWTH

## 2017-03-27 MED ORDER — CYCLOBENZAPRINE HCL 10 MG PO TABS: 10.0000 mg | ORAL_TABLET | Freq: Three times a day (TID) | ORAL | 0 refills | Status: DC | PRN

## 2017-03-27 MED ORDER — POLYETHYLENE GLYCOL 3350 17 GM/SCOOP PO POWD: 17.0000 g | Freq: Every day | ORAL | 1 refills | Status: DC

## 2017-03-27 NOTE — Patient Instructions (Addendum)
     IF you received an x-ray today, you will receive an invoice from [facility]. Please contact [facility] at [phone] with questions or concerns regarding your invoice.   IF you received labwork today, you will receive an invoice from [facility]. Please contact [facility] at [phone] with questions or concerns regarding your invoice.   Our billing staff will not be able to assist you with questions regarding bills from these companies.  You will be contacted with the lab results as soon as they are available. The fastest way to get your results is to activate your My Chart account. Instructions are located on the last page of this paperwork. If you have not heard from us regarding the results in 2 weeks, please contact this office.     Constipation, Adult Constipation is when a person:  Poops (has a bowel movement) fewer times in a week than normal.  Has a hard time pooping.  Has poop that is dry, hard, or bigger than normal.  Follow these instructions at home: Eating and drinking   Eat foods that have a lot of fiber, such as: ? Fresh fruits and vegetables. ? Whole grains. ? Beans.  Eat less of foods that are high in fat, low in fiber, or overly processed, such as: ? French fries. ? Hamburgers. ? Cookies. ? Candy. ? Soda.  Drink enough fluid to keep your pee (urine) clear or pale yellow. General instructions  Exercise regularly or as told by your doctor.  Go to the restroom when you feel like you need to poop. Do not hold it in.  Take over-the-counter and prescription medicines only as told by your doctor. These include any fiber supplements.  Do pelvic floor retraining exercises, such as: ? Doing deep breathing while relaxing your lower belly (abdomen). ? Relaxing your pelvic floor while pooping.  Watch your condition for any changes.  Keep all follow-up visits as told by your doctor. This is important. Contact a doctor if:  You have  pain that gets worse.  You have a fever.  You have not pooped for 4 days.  You throw up (vomit).  You are not hungry.  You lose weight.  You are bleeding from the anus.  You have thin, pencil-like poop (stool). Get help right away if:  You have a fever, and your symptoms suddenly get worse.  You leak poop or have blood in your poop.  Your belly feels hard or bigger than normal (is bloated).  You have very bad belly pain.  You feel dizzy or you faint. This information is not intended to replace advice given to you by your health care provider. Make sure you discuss any questions you have with your health care provider. Document Released: 08/21/2007 Document Revised: 09/22/2015 Document Reviewed: 08/23/2015 Elsevier Interactive Patient Education  2018 Elsevier Inc.  

## 2017-03-27 NOTE — Progress Notes (Signed)
1/10/20193:36 PM  Meagan Lee 03-11-1986, 32 y.o. female [id number]  Chief Complaint  Patient presents with  . Pneumonia    ER VISIT YESTERDAY  . Back Pain    STOMACH PAIN FOR 5 DAYS    HPI:   Patient is a 32 y.o. female who presents today for ER followup.   Seen yesterday. Dx with CAP. Started azithromycin. Denies fevers, SOB. + cough and fatigue  She also has been having lower abd pain, mild distention, no nausea, no vomiting, reports last BM about 5 days ago. Is passing gas.  She also reports left lower abd pain, spasms, does not radiate. Had ct scan for renal stones done in ER which was negative. Denies any inciting event or recent trauma. Pain worse with movement.   She is currently on her period  Depression screen [facility] 2/9 03/27/2017 01/02/2017 07/01/2016  Decreased Interest 0 0 0  Down, Depressed, Hopeless 0 0 0  PHQ - 2 Score 0 0 0    No Known Allergies  Prior to Admission medications   Medication Sig Start Date End Date Taking? Authorizing Provider  azithromycin (ZITHROMAX Z-PAK) 250 MG tablet Take 1 tablet (250 mg total) by mouth daily for 4 doses. 03/25/17 03/29/17 Yes Liberty Handy, PA-C  ibuprofen (ADVIL,MOTRIN) 200 MG tablet Take 200 mg by mouth every 6 (six) hours as needed for moderate pain.   Yes [provider]    History reviewed. No pertinent past medical history.  Past Surgical History:  Procedure Laterality Date  . CESAREAN SECTION      Social History   Tobacco Use  . Smoking status: Never Smoker  . Smokeless tobacco: Never Used  Substance Use Topics  . Alcohol use: No    Family History  Problem Relation Age of Onset  . Hypertension Maternal Grandmother   . Asthma Maternal Grandfather     ROS Per hpi  OBJECTIVE:  Blood pressure 110/74, pulse 98, temperature 97.9 F (36.6 C), temperature source Oral, height 5' 4.17" (1.63 m), weight 170 lb (77.1 kg), last menstrual period 03/24/2017, SpO2 98 %.  Physical Exam    Constitutional: She is oriented to person, place, and time and well-developed, well-nourished, and in no distress.  HENT:  Head: Normocephalic and atraumatic.  Mouth/Throat: Oropharynx is clear and moist. No oropharyngeal exudate.  Eyes: EOM are normal. Pupils are equal, round, and reactive to light. No scleral icterus.  Neck: Neck supple.  Cardiovascular: Normal rate, regular rhythm and normal heart sounds. Exam reveals no gallop and no friction rub.  No murmur heard. Pulmonary/Chest: Effort normal and breath sounds normal. She has no wheezes. She has no rales.  Abdominal: Soft. Bowel sounds are normal. She exhibits no distension. There is no hepatosplenomegaly. There is tenderness in the left upper quadrant and left lower quadrant. There is no rebound, no guarding and no CVA tenderness.  Musculoskeletal: She exhibits no edema.       Lumbar back: She exhibits tenderness and spasm. She exhibits normal range of motion and no bony tenderness.  Neurological: She is alert and oriented to person, place, and time. She has normal strength and normal reflexes. She has a normal Straight Leg Raise Test. Gait normal.  Skin: Skin is warm and dry.     Results for orders placed or performed in visit on 03/27/17 (from the past 24 hour(s))  POCT urinalysis dipstick     Status: Abnormal   Collection Time: 03/27/17  3:10 PM  Result Value Ref Range  Color, UA brown (A) yellow   Clarity, UA turbid (A) clear   Glucose, UA negative negative mg/dL   Bilirubin, UA small (A) negative   Ketones, POC UA negative negative mg/dL   Spec Grav, UA >=4.098>=1.030 (A) 1.010 - 1.025   Blood, UA moderate (A) negative   pH, UA 5.5 5.0 - 8.0   Protein Ur, POC trace (A) negative mg/dL   Urobilinogen, UA 0.2 0.2 or 1.0 E.U./dL   Nitrite, UA Negative Negative   Leukocytes, UA Negative Negative    ASSESSMENT and PLAN  1. Acute left-sided low back pain without sciatica  2. Abdominal pain, unspecified abdominal location -  POCT urinalysis dipstick  3. Constipation, unspecified constipation type  Discussed supportive measures, new meds r/se/b and RTC precautions. Patient educational handout given.  Other orders - cyclobenzaprine (FLEXERIL) 10 MG tablet; Take 1 tablet (10 mg total) by mouth 3 (three) times daily as needed for muscle spasms. - polyethylene glycol powder (GLYCOLAX/MIRALAX) powder; Take 17 g by mouth daily.  No Follow-up on file.    Myles LippsIrma M Santiago, MD [facility] [address] [place], [place] [address] Ph.  [phone] Fax [phone]

## 2017-04-02 ENCOUNTER — Encounter: Payer: Self-pay | Admitting: Family Medicine

## 2018-04-13 ENCOUNTER — Ambulatory Visit: Payer: BLUE CROSS/BLUE SHIELD | Admitting: Family Medicine

## 2018-07-16 ENCOUNTER — Telehealth (INDEPENDENT_AMBULATORY_CARE_PROVIDER_SITE_OTHER): Payer: BLUE CROSS/BLUE SHIELD | Admitting: Family Medicine

## 2018-07-16 ENCOUNTER — Other Ambulatory Visit: Payer: Self-pay

## 2018-07-16 ENCOUNTER — Encounter: Payer: Self-pay | Admitting: Family Medicine

## 2018-07-16 ENCOUNTER — Telehealth: Payer: BLUE CROSS/BLUE SHIELD | Admitting: Family Medicine

## 2018-07-16 VITALS — BP 110/68 | HR 76 | Temp 98.3°F | Ht 64.17 in | Wt 182.6 lb

## 2018-07-16 DIAGNOSIS — Z3202 Encounter for pregnancy test, result negative: Secondary | ICD-10-CM

## 2018-07-16 DIAGNOSIS — R309 Painful micturition, unspecified: Secondary | ICD-10-CM | POA: Diagnosis not present

## 2018-07-16 DIAGNOSIS — N926 Irregular menstruation, unspecified: Secondary | ICD-10-CM | POA: Diagnosis not present

## 2018-07-16 LAB — POCT URINALYSIS DIP (CLINITEK)
Bilirubin, UA: NEGATIVE
Glucose, UA: NEGATIVE mg/dL
Ketones, POC UA: NEGATIVE mg/dL
Leukocytes, UA: NEGATIVE
Nitrite, UA: NEGATIVE
POC PROTEIN,UA: NEGATIVE
Spec Grav, UA: 1.02 (ref 1.010–1.025)
Urobilinogen, UA: 0.2 E.U./dL
pH, UA: 5.5 (ref 5.0–8.0)

## 2018-07-16 LAB — POCT URINE PREGNANCY: Preg Test, Ur: NEGATIVE

## 2018-07-16 LAB — POC MICROSCOPIC URINALYSIS (UMFC): Mucus: ABSENT

## 2018-07-16 NOTE — Progress Notes (Signed)
21 days trouble with urination.  Missed pd for 5 days. Home test neg.

## 2018-07-17 ENCOUNTER — Telehealth: Payer: Self-pay | Admitting: Family Medicine

## 2018-07-17 DIAGNOSIS — R309 Painful micturition, unspecified: Secondary | ICD-10-CM | POA: Insufficient documentation

## 2018-07-17 DIAGNOSIS — N926 Irregular menstruation, unspecified: Secondary | ICD-10-CM | POA: Insufficient documentation

## 2018-07-17 LAB — HCG, SERUM, QUALITATIVE: hCG,Beta Subunit,Qual,Serum: NEGATIVE m[IU]/mL (ref <6)

## 2018-07-17 LAB — URINE CULTURE

## 2018-07-17 NOTE — Progress Notes (Signed)
Acute Office Visit  Subjective:    Patient ID: Meagan Lee, female    DOB: 07/09/1985, 33 y.o.   MRN: [id number]   cc 21 days trouble with urination.  Missed pd for 5 days. Home test neg.   HPI Patient is in today for pain with urination and concern for possible pregnancy-5 days later to start period-no IUD for 2 years  Pt states pain with urination-burning sensation in urethra. No frequency, no urgency. No blood. No abdominal pain. No pelvic pain.    Pt with heavy extended periods for several months. Pt states periods lasting longer and days between periods extended.   G2P2-C sections No h/o gyn surgeries  Past Surgical History:  Procedure Laterality Date  . CESAREAN SECTION      Family History  Problem Relation Age of Onset  . Hypertension Maternal Grandmother   . Asthma Maternal Grandfather     Social History   Socioeconomic History  . Marital status: Married    Spouse name: Not on file  . Number of children: Not on file  . Years of education: Not on file  . Highest education level: Not on file  Occupational History  . Not on file  Social Needs  . Financial resource strain: Not on file  . Food insecurity:    Worry: Not on file    Inability: Not on file  . Transportation needs:    Medical: Not on file    Non-medical: Not on file  Tobacco Use  . Smoking status: Never Smoker  . Smokeless tobacco: Never Used  Substance and Sexual Activity  . Alcohol use: No  . Drug use: No  . Sexual activity: Not on file  Lifestyle  . Physical activity:    Days per week: Not on file    Minutes per session: Not on file  . Stress: Not on file  Relationships  . Social connections:    Talks on phone: Not on file    Gets together: Not on file    Attends religious service: Not on file    Active member of club or organization: Not on file    Attends meetings of clubs or organizations: Not on file    Relationship status: Not on file  . Intimate partner violence:    Fear  of current or ex partner: Not on file    Emotionally abused: Not on file    Physically abused: Not on file    Forced sexual activity: Not on file  Other Topics Concern  . Not on file  Social History Narrative  . Not on file    Outpatient Medications Prior to Visit  Medication Sig Dispense Refill  . ibuprofen (ADVIL,MOTRIN) 200 MG tablet Take 200 mg by mouth every 6 (six) hours as needed for moderate pain.    . cyclobenzaprine (FLEXERIL) 10 MG tablet Take 1 tablet (10 mg total) by mouth 3 (three) times daily as needed for muscle spasms. (Patient not taking: Reported on 07/16/2018) 30 tablet 0  . polyethylene glycol powder (GLYCOLAX/MIRALAX) powder Take 17 g by mouth daily. (Patient not taking: Reported on 07/16/2018) 250 g 1   No facility-administered medications prior to visit.     No Known Allergies  ROS CONSTITUTIONAL: no, fever EENT:no sinus problems, CV: no chest pain RESP: no SOB, cough,  GI:  abdominal pain, GU: pain with urination,-externa     Objective:    Physical Exam  Constitutional: She appears well-developed and well-nourished. No distress.  Cardiovascular: Normal  rate and regular rhythm.  Pulmonary/Chest: Effort normal and breath sounds normal.  Abdominal: Soft. Bowel sounds are normal.    BP 110/68 (BP Location: Right Arm, Patient Position: Sitting, Cuff Size: Normal)   Pulse 76   Temp 98.3 F (36.8 C) (Oral)   Ht 5' 4.17" (1.63 m)   Wt 182 lb 9.6 oz (82.8 kg)   LMP 06/09/2018   SpO2 100%   BMI 31.17 kg/m  Wt Readings from Last 3 Encounters:  07/16/18 182 lb 9.6 oz (82.8 kg)  03/27/17 170 lb (77.1 kg)  01/02/17 169 lb 9.6 oz (76.9 kg)    Health Maintenance Due  Topic Date Due  . HIV Screening  05/03/2000  . TETANUS/TDAP  05/03/2004  . PAP SMEAR-Modifier  05/03/2006    There are no preventive care reminders to display for this patient.   No results found for: TSH Lab Results  Component Value Date   WBC none 07/16/2018   HGB 12.6  03/25/2017   HCT 38.6 03/25/2017   MCV 84.6 03/25/2017   PLT 301 03/25/2017   Lab Results  Component Value Date   NA 136 03/25/2017   K 3.8 03/25/2017   CO2 20 (L) 03/25/2017   GLUCOSE 112 (H) 03/25/2017   BUN 13 03/25/2017   CREATININE 0.49 03/25/2017   BILITOT 0.9 03/25/2017   ALKPHOS 56 03/25/2017   AST 21 03/25/2017   ALT 20 03/25/2017   PROT 7.2 03/25/2017   ALBUMIN 3.7 03/25/2017   CALCIUM 9.2 03/25/2017   ANIONGAP 9 03/25/2017   No results found for: CHOL No results found for: HDL No results found for: LDLCALC No results found for: TRIG No results found for: CHOLHDL No results found for: [id number]     Assessment & Plan:   Problem List Items Addressed This Visit      Other   Missed menses - Primary   Relevant Orders   POCT urine pregnancy (Completed)   hCG, serum, qualitative   POCT Microscopic Urinalysis ([facility]) (Completed)   Pain with urination   Relevant Orders   POCT URINALYSIS DIP (CLINITEK) (Completed)   Urine Culture   POCT Microscopic Urinalysis ([facility]) (Completed)     No medication-culture pending-if no improvement f/u for pelvic exam  No orders of the defined types were placed in this encounter.    LISA Mat CarneLEIGH CORUM, MD

## 2018-07-17 NOTE — Telephone Encounter (Signed)
Please review results and advise.

## 2018-07-17 NOTE — Telephone Encounter (Signed)
Copied from CRM [id number]. Topic: Quick Communication - See Telephone Encounter >> Jul 17, 2018  2:40 PM Angela Nevin wrote: CRM for notification. See Telephone encounter for: 07/17/18.  Patient calling to check status of lab results. Please advise.

## 2018-08-13 ENCOUNTER — Telehealth (INDEPENDENT_AMBULATORY_CARE_PROVIDER_SITE_OTHER): Payer: BLUE CROSS/BLUE SHIELD | Admitting: Family Medicine

## 2018-08-13 ENCOUNTER — Other Ambulatory Visit: Payer: Self-pay

## 2018-08-13 VITALS — BP 135/75 | HR 96 | Temp 98.4°F | Ht 64.0 in | Wt 180.6 lb

## 2018-08-13 DIAGNOSIS — N912 Amenorrhea, unspecified: Secondary | ICD-10-CM | POA: Insufficient documentation

## 2018-08-13 NOTE — Progress Notes (Signed)
Acute Office Visit  Subjective:    Patient ID: Meagan Lee, female    DOB: April 26, 1985, 33 y.o.   MRN: [id number]  Cc: no period in April and May  HPI Patient is in today for Jan/11 to Feb 17 to March 23  Cycles with longer number of days inbetween- usually 28-30 days. Pt states normal number of days 5-8.  No contraception.  No spotting. Pt with 33yo, 33yo.  Normal pregnancy-c section due to preeclampsia.  NO meds currently. NO increase stress. No PNV. No illness  PMH -normal healthy-no medical problems  Past Surgical History:  Procedure Laterality Date  . CESAREAN SECTION      Family History  Problem Relation Age of Onset  . Hypertension Maternal Grandmother   . Asthma Maternal Grandfather     Social History   Socioeconomic History  . Marital status: Married    Spouse name: Not on file  . Number of children: Not on file  . Years of education: Not on file  . Highest education level: Not on file  Occupational History  . Not on file  Social Needs  . Financial resource strain: Not on file  . Food insecurity:    Worry: Not on file    Inability: Not on file  . Transportation needs:    Medical: Not on file    Non-medical: Not on file  Tobacco Use  . Smoking status: Never Smoker  . Smokeless tobacco: Never Used  Substance and Sexual Activity  . Alcohol use: No  . Drug use: No  . Sexual activity: Not on file  Lifestyle  . Physical activity:    Days per week: Not on file    Minutes per session: Not on file  . Stress: Not on file  Relationships  . Social connections:    Talks on phone: Not on file    Gets together: Not on file    Attends religious service: Not on file    Active member of club or organization: Not on file    Attends meetings of clubs or organizations: Not on file    Relationship status: Not on file  . Intimate partner violence:    Fear of current or ex partner: Not on file    Emotionally abused: Not on file    Physically abused: Not on file   Forced sexual activity: Not on file  Other Topics Concern  . Not on file  Social History Narrative  . Not on file    Outpatient Medications Prior to Visit  Medication Sig Dispense Refill  . ibuprofen (ADVIL,MOTRIN) 200 MG tablet Take 200 mg by mouth every 6 (six) hours as needed for moderate pain.    . cyclobenzaprine (FLEXERIL) 10 MG tablet Take 1 tablet (10 mg total) by mouth 3 (three) times daily as needed for muscle spasms. (Patient not taking: Reported on 07/16/2018) 30 tablet 0  . polyethylene glycol powder (GLYCOLAX/MIRALAX) powder Take 17 g by mouth daily. (Patient not taking: Reported on 07/16/2018) 250 g 1   No facility-administered medications prior to visit.     No Known Allergies  Review of Systems  Constitutional: Negative for fever.  Respiratory: Negative for cough.   Gastrointestinal: Negative for abdominal pain, nausea and vomiting.  Genitourinary: Negative for dysuria.  Neurological: Negative for seizures.  Psychiatric/Behavioral: The patient does not have insomnia.   no breast tenderness ]no nausea/vomiting     Objective:    Physical Exam  Constitutional: She is oriented to person, place,  and time. She appears well-developed and well-nourished.  HENT:  Right Ear: External ear normal.  Left Ear: External ear normal.  Eyes: Conjunctivae are normal.  Cardiovascular: Normal rate and regular rhythm.  Pulmonary/Chest: Effort normal and breath sounds normal.  Abdominal: Soft. Bowel sounds are normal. There is no abdominal tenderness. There is no rebound and no guarding.  Neurological: She is alert and oriented to person, place, and time.  Psychiatric: She has a normal mood and affect.    BP 135/75 (BP Location: Left Arm, Patient Position: Sitting, Cuff Size: Normal)   Pulse 96   Temp 98.4 F (36.9 C) (Oral)   Ht 5\' 4"  (1.626 m)   Wt 180 lb 9.6 oz (81.9 kg)   LMP 06/11/2018   SpO2 96%   BMI 31.00 kg/m  Wt Readings from Last 3 Encounters:  08/13/18 180  lb 9.6 oz (81.9 kg)  07/16/18 182 lb 9.6 oz (82.8 kg)  03/27/17 170 lb (77.1 kg)    Health Maintenance Due  Topic Date Due  . HIV Screening  05/03/2000  . TETANUS/TDAP  05/03/2004  . PAP SMEAR-Modifier  05/03/2006     No results found for: TSH Lab Results  Component Value Date   WBC none 07/16/2018   HGB 12.6 03/25/2017   HCT 38.6 03/25/2017   MCV 84.6 03/25/2017   PLT 301 03/25/2017   Lab Results  Component Value Date   NA 136 03/25/2017   K 3.8 03/25/2017   CO2 20 (L) 03/25/2017   GLUCOSE 112 (H) 03/25/2017   BUN 13 03/25/2017   CREATININE 0.49 03/25/2017   BILITOT 0.9 03/25/2017   ALKPHOS 56 03/25/2017   AST 21 03/25/2017   ALT 20 03/25/2017   PROT 7.2 03/25/2017   ALBUMIN 3.7 03/25/2017   CALCIUM 9.2 03/25/2017   ANIONGAP 9 03/25/2017     Assessment & Plan:   1. Amenorrhea Prolactin, HCG, TSH, FSH  LISA Mat Carne, MD

## 2018-08-13 NOTE — Progress Notes (Signed)
Last cycle was 06/11/2018. Home test says she is not pregnant.

## 2018-08-13 NOTE — Patient Instructions (Signed)
° ° ° °  If you have lab work done today you will be contacted with your lab results within the next 2 weeks.  If you have not heard from us then please contact us. The fastest way to get your results is to register for My Chart. ° ° °IF you received an x-ray today, you will receive an invoice from [facility]. Please contact [facility] at [phone] with questions or concerns regarding your invoice.  ° °IF you received labwork today, you will receive an invoice from [facility]. Please contact [facility] at [phone] with questions or concerns regarding your invoice.  ° °Our billing staff will not be able to assist you with questions regarding bills from these companies. ° °You will be contacted with the lab results as soon as they are available. The fastest way to get your results is to activate your My Chart account. Instructions are located on the last page of this paperwork. If you have not heard from us regarding the results in 2 weeks, please contact this office. °  ° ° ° °

## 2018-08-14 LAB — TSH: TSH: 2.23 u[IU]/mL (ref 0.450–4.500)

## 2018-08-14 LAB — PROLACTIN: Prolactin: 7.6 ng/mL (ref 4.8–23.3)

## 2018-08-14 LAB — FOLLICLE STIMULATING HORMONE: FSH: 6.9 m[IU]/mL

## 2018-08-14 LAB — HCG, SERUM, QUALITATIVE: hCG,Beta Subunit,Qual,Serum: NEGATIVE m[IU]/mL (ref <6)

## 2018-08-18 ENCOUNTER — Telehealth: Payer: Self-pay

## 2018-08-18 NOTE — Telephone Encounter (Signed)
Gave pt lab results. She would like to know what the next recommendation is for she has missed her period again.

## 2018-08-20 ENCOUNTER — Other Ambulatory Visit: Payer: Self-pay | Admitting: Family Medicine

## 2018-08-20 DIAGNOSIS — N912 Amenorrhea, unspecified: Secondary | ICD-10-CM

## 2018-08-20 NOTE — Telephone Encounter (Signed)
suggestGYN referral-made

## 2018-10-05 ENCOUNTER — Telehealth (INDEPENDENT_AMBULATORY_CARE_PROVIDER_SITE_OTHER): Payer: Self-pay | Admitting: Obstetrics & Gynecology

## 2018-10-05 ENCOUNTER — Other Ambulatory Visit: Payer: Self-pay

## 2018-10-05 ENCOUNTER — Encounter: Payer: Self-pay | Admitting: Obstetrics & Gynecology

## 2018-10-05 DIAGNOSIS — N914 Secondary oligomenorrhea: Secondary | ICD-10-CM

## 2018-10-05 NOTE — Progress Notes (Signed)
I connected with  Meagan Lee on 10/05/18 at  1:55 PM EDT by telephone and verified that I am speaking with the correct person using two identifiers.   I discussed the limitations, risks, security and privacy concerns of performing an evaluation and management service by telephone and the availability of in person appointments. I also discussed with the patient that there may be a patient responsible charge related to this service. The patient expressed understanding and agreed to proceed.  [name], [place] 10/05/2018  1:38 PM  Patient speaks ok english but needed to hold call and use pacific interpreter. [place]

## 2018-10-05 NOTE — Progress Notes (Signed)
   TELEHEALTH VIRTUAL GYNECOLOGY VISIT ENCOUNTER NOTE  I connected with [name] on 10/05/18 at  1:55 PM EDT by telephone at home and verified that I am speaking with the correct person using two identifiers.   I discussed the limitations, risks, security and privacy concerns of performing an evaluation and management service by telephone and the availability of in person appointments. I also discussed with the patient that there may be a patient responsible charge related to this service. The patient expressed understanding and agreed to proceed.   History:  Meagan Lee is a 33 y.o. [id number] . female being evaluated today for oligomenorrhea. LMP 7/11/202. She reports that she previously has reg cycles then in 03/2018 her cycles have become less frequent. It has been delayed by 3 months. She denies any abnormal vaginal discharge, bleeding, pelvic pain or other concerns.  Pt has gained some weight 5kg over the past year.  Her primary care provider drew labs. Pts last child is 5 years. She has been sexually active 2 years with no pregnancy.   Pt has an IUD until Jan 2019.     No past medical history on file. Past Surgical History:  Procedure Laterality Date  . CESAREAN SECTION     The following portions of the patient's history were reviewed and updated as appropriate: allergies, current medications, past family history, past medical history, past social history, past surgical history and problem list.    Review of Systems:  Pertinent items noted in HPI and remainder of comprehensive ROS otherwise negative.  Physical Exam:   General:  Alert, oriented and cooperative.   Mental Status: Normal mood and affect perceived. Normal judgment and thought content.  Physical exam deferred due to nature of the encounter  Labs and Imaging  Assessment and Plan:     Oligomenorrhea- labs done by primary care provider. They are WNL.   TV US      I discussed the assessment and treatment plan with  the patient. The patient was provided an opportunity to ask questions and all were answered. The patient agreed with the plan and demonstrated an understanding of the instructions.   The patient was advised to call back or seek an in-person evaluation/go to the ED if the symptoms worsen or if the condition fails to improve as anticipated.  Arabic interpreter used for entire visit.   I provided 15 minutes of non-face-to-face time during this encounter.   Lavonia Drafts, MD [facility], [place]

## 2018-10-14 ENCOUNTER — Ambulatory Visit (HOSPITAL_COMMUNITY)
Admission: RE | Admit: 2018-10-14 | Discharge: 2018-10-14 | Disposition: A | Discharge status: Home / Self Care | Payer: BC Managed Care – PPO | Source: Ambulatory Visit | Attending: Obstetrics & Gynecology | Admitting: Obstetrics & Gynecology

## 2018-10-14 ENCOUNTER — Other Ambulatory Visit: Payer: Self-pay

## 2018-10-14 DIAGNOSIS — N914 Secondary oligomenorrhea: Secondary | ICD-10-CM | POA: Insufficient documentation

## 2018-12-03 ENCOUNTER — Ambulatory Visit (INDEPENDENT_AMBULATORY_CARE_PROVIDER_SITE_OTHER): Payer: BC Managed Care – PPO | Admitting: Family Medicine

## 2018-12-03 ENCOUNTER — Other Ambulatory Visit: Payer: Self-pay

## 2018-12-03 ENCOUNTER — Encounter: Payer: Self-pay | Admitting: Family Medicine

## 2018-12-03 VITALS — BP 121/81 | HR 88 | Temp 98.1°F | Resp 18 | Wt 182.6 lb

## 2018-12-03 DIAGNOSIS — N644 Mastodynia: Secondary | ICD-10-CM | POA: Diagnosis not present

## 2018-12-03 DIAGNOSIS — N912 Amenorrhea, unspecified: Secondary | ICD-10-CM

## 2018-12-03 DIAGNOSIS — Z3A01 Less than 8 weeks gestation of pregnancy: Secondary | ICD-10-CM

## 2018-12-03 LAB — POCT URINE PREGNANCY: Preg Test, Ur: POSITIVE — AB

## 2018-12-03 MED ORDER — PRENATAL VITAMIN 27-0.8 MG PO TABS: 1.0000 | ORAL_TABLET | Freq: Once | ORAL | 12 refills | Status: AC

## 2018-12-03 NOTE — Patient Instructions (Signed)
Take prenatal vitamins daily  Avoid medications unless you have checked with the pharmacist to make sure they are safe in pregnancy.  Arrange an appointment with a obstetrician.  Return as needed   First Trimester of Pregnancy  The first trimester of pregnancy is from week 1 until the end of week 13 (months 1 through 3). During this time, your baby will begin to develop inside you. At 6-8 weeks, the eyes and face are formed, and the heartbeat can be seen on ultrasound. At the end of 12 weeks, all the baby's organs are formed. Prenatal care is all the medical care you receive before the birth of your baby. Make sure you get good prenatal care and follow all of your doctor's instructions. Follow these instructions at home: Medicines  Take over-the-counter and prescription medicines only as told by your doctor. Some medicines are safe and some medicines are not safe during pregnancy.  Take a prenatal vitamin that contains at least 600 micrograms (mcg) of folic acid.  If you have trouble pooping (constipation), take medicine that will make your stool soft (stool softener) if your doctor approves. Eating and drinking   Eat regular, healthy meals.  Your doctor will tell you the amount of weight gain that is right for you.  Avoid raw meat and uncooked cheese.  If you feel sick to your stomach (nauseous) or throw up (vomit): ? Eat 4 or 5 small meals a day instead of 3 large meals. ? Try eating a few soda crackers. ? Drink liquids between meals instead of during meals.  To prevent constipation: ? Eat foods that are high in fiber, like fresh fruits and vegetables, whole grains, and beans. ? Drink enough fluids to keep your pee (urine) clear or pale yellow. Activity  Exercise only as told by your doctor. Stop exercising if you have cramps or pain in your lower belly (abdomen) or low back.  Do not exercise if it is too hot, too humid, or if you are in a place of great height (high  altitude).  Try to avoid standing for long periods of time. Move your legs often if you must stand in one place for a long time.  Avoid heavy lifting.  Wear low-heeled shoes. Sit and stand up straight.  You can have sex unless your doctor tells you not to. Relieving pain and discomfort  Wear a good support bra if your breasts are sore.  Take warm water baths (sitz baths) to soothe pain or discomfort caused by hemorrhoids. Use hemorrhoid cream if your doctor says it is okay.  Rest with your legs raised if you have leg cramps or low back pain.  If you have puffy, bulging veins (varicose veins) in your legs: ? Wear support hose or compression stockings as told by your doctor. ? Raise (elevate) your feet for 15 minutes, 3-4 times a day. ? Limit salt in your food. Prenatal care  Schedule your prenatal visits by the twelfth week of pregnancy.  Write down your questions. Take them to your prenatal visits.  Keep all your prenatal visits as told by your doctor. This is important. Safety  Wear your seat belt at all times when driving.  Make a list of emergency phone numbers. The list should include numbers for family, friends, the hospital, and police and fire departments. General instructions  Ask your doctor for a referral to a local prenatal class. Begin classes no later than at the start of month 6 of your pregnancy.  Ask for help if you need counseling or if you need help with nutrition. Your doctor can give you advice or tell you where to go for help.  Do not use hot tubs, steam rooms, or saunas.  Do not douche or use tampons or scented sanitary pads.  Do not cross your legs for long periods of time.  Avoid all herbs and alcohol. Avoid drugs that are not approved by your doctor.  Do not use any tobacco products, including cigarettes, chewing tobacco, and electronic cigarettes. If you need help quitting, ask your doctor. You may get counseling or other support to help you  quit.  Avoid cat litter boxes and soil used by cats. These carry germs that can cause birth defects in the baby and can cause a loss of your baby (miscarriage) or stillbirth.  Visit your dentist. At home, brush your teeth with a soft toothbrush. Be gentle when you floss. Contact a doctor if:  You are dizzy.  You have mild cramps or pressure in your lower belly.  You have a nagging pain in your belly area.  You continue to feel sick to your stomach, you throw up, or you have watery poop (diarrhea).  You have a bad smelling fluid coming from your vagina.  You have pain when you pee (urinate).  You have increased puffiness (swelling) in your face, hands, legs, or ankles. Get help right away if:  You have a fever.  You are leaking fluid from your vagina.  You have spotting or bleeding from your vagina.  You have very bad belly cramping or pain.  You gain or lose weight rapidly.  You throw up blood. It may look like coffee grounds.  You are around people who have [place] measles, fifth disease, or chickenpox.  You have a very bad headache.  You have shortness of breath.  You have any kind of trauma, such as from a fall or a car accident. Summary  The first trimester of pregnancy is from week 1 until the end of week 13 (months 1 through 3).  To take care of yourself and your unborn baby, you will need to eat healthy meals, take medicines only if your doctor tells you to do so, and do activities that are safe for you and your baby.  Keep all follow-up visits as told by your doctor. This is important as your doctor will have to ensure that your baby is healthy and growing well. This information is not intended to replace advice given to you by your health care provider. Make sure you discuss any questions you have with your health care provider. Document Released: 08/21/2007 Document Revised: 06/25/2018 Document Reviewed: 03/12/2016 Elsevier Patient Education  2020 [employer].

## 2018-12-03 NOTE — Progress Notes (Signed)
Patient ID: Meagan Lee, female    DOB: 1985-04-07  Age: 33 y.o. MRN: [id number]  Chief Complaint  Patient presents with  . Possible Pregnancy    lmp 08/17, breast tenderness, fatigue, x1 week    Subjective:   Patient is here thinking she could be pregnant.  Her last menstrual cycle was August 17.  She is not using any contraception.  She would like to get pregnant.  She has 2 female children and would like a daughter.  She is from [place], lives here in the [place].  Current allergies, medications, problem list, past/family and social histories reviewed.  Objective:  BP 121/81 (BP Location: Right Arm, Patient Position: Sitting, Cuff Size: Normal)   Pulse 88   Temp 98.1 F (36.7 C) (Oral)   Resp 18   Wt 182 lb 9.6 oz (82.8 kg)   LMP 11/02/2018   SpO2 98%   BMI 31.34 kg/m   No major complaints.  Did not do a physical exam on today.  Parents test positive  Assessment & Plan:   Assessment: 1. Breast tenderness in female       Plan: See instructions  Orders Placed This Encounter  Procedures  . POCT urine pregnancy    No orders of the defined types were placed in this encounter.        There are no Patient Instructions on file for this visit.   No follow-ups on file.   Ruben Reason, MD 12/03/2018

## 2018-12-14 ENCOUNTER — Telehealth: Payer: Self-pay | Admitting: Obstetrics & Gynecology

## 2018-12-14 NOTE — Telephone Encounter (Signed)
Attempted to call patient w/ her follow-up appointment w/ [facility] ( 10/23  @ 8:55). No answer and could not leave a voicemail with appointment information. Mychart message sent and appointment reminder mailed.

## 2018-12-23 ENCOUNTER — Telehealth: Payer: Self-pay | Admitting: Obstetrics and Gynecology

## 2018-12-23 NOTE — Telephone Encounter (Signed)
The patient called in stating she has nausea and cant eat keep anything down and she has an headache. She stated she needs an earlier appointment. Informed the patient of the pcp for the needs as we are a gyn office. She stated she is now pregnant. She also states she would like a call back regarding the nausea and experiencing bleeding sometimes. One time after sex and three other occassions.

## 2018-12-25 NOTE — Telephone Encounter (Signed)
Called pt with assistance of [place]  Interpreter # [phone].   Pt was called and pt was not available and mailbox was full and not able to leave a message for the pt.

## 2019-01-08 ENCOUNTER — Telehealth: Payer: Self-pay | Admitting: Obstetrics & Gynecology

## 2019-01-08 ENCOUNTER — Other Ambulatory Visit: Payer: Self-pay

## 2019-01-08 ENCOUNTER — Encounter: Payer: Self-pay | Admitting: Obstetrics & Gynecology

## 2019-01-08 DIAGNOSIS — Z3200 Encounter for pregnancy test, result unknown: Secondary | ICD-10-CM

## 2019-01-08 DIAGNOSIS — N912 Amenorrhea, unspecified: Secondary | ICD-10-CM

## 2019-01-08 NOTE — Progress Notes (Signed)
I connected with  Meagan Lee on 01/08/19 at  8:55 AM EDT by telephone and verified that I am speaking with the correct person using two identifiers.   I discussed the limitations, risks, security and privacy concerns of performing an evaluation and management service by telephone and the availability of in person appointments. I also discussed with the patient that there may be a patient responsible charge related to this service. The patient expressed understanding and agreed to proceed.  [name], [place] 01/08/2019  8:48 AM  Patient is no longer Gyn had a positive UPT on 12/03/2018 Will get her scheduled ASAP for NOB.   Patient complains of nausea and that she has lost 11lbs and would like to see a doctor now.

## 2019-01-08 NOTE — Progress Notes (Signed)
Pt was not seen today. She is pregnant and needs an OB intake.    Lavonia Drafts, MD [facility], [place]

## 2019-01-11 ENCOUNTER — Ambulatory Visit (INDEPENDENT_AMBULATORY_CARE_PROVIDER_SITE_OTHER): Payer: Medicaid Other | Admitting: Obstetrics & Gynecology

## 2019-01-11 ENCOUNTER — Other Ambulatory Visit: Payer: Self-pay

## 2019-01-11 ENCOUNTER — Other Ambulatory Visit (HOSPITAL_COMMUNITY)
Admission: RE | Admit: 2019-01-11 | Discharge: 2019-01-11 | Disposition: A | Discharge status: Home / Self Care | Payer: BC Managed Care – PPO | Source: Ambulatory Visit | Attending: Obstetrics & Gynecology | Admitting: Obstetrics & Gynecology

## 2019-01-11 DIAGNOSIS — O09299 Supervision of pregnancy with other poor reproductive or obstetric history, unspecified trimester: Secondary | ICD-10-CM

## 2019-01-11 DIAGNOSIS — O3680X0 Pregnancy with inconclusive fetal viability, not applicable or unspecified: Secondary | ICD-10-CM

## 2019-01-11 DIAGNOSIS — Z3480 Encounter for supervision of other normal pregnancy, unspecified trimester: Secondary | ICD-10-CM | POA: Diagnosis present

## 2019-01-11 DIAGNOSIS — O34219 Maternal care for unspecified type scar from previous cesarean delivery: Secondary | ICD-10-CM

## 2019-01-11 DIAGNOSIS — Z98891 History of uterine scar from previous surgery: Secondary | ICD-10-CM | POA: Insufficient documentation

## 2019-01-11 NOTE — Progress Notes (Signed)
  Subjective:    Meagan Lee is being seen today for her first obstetrical visit.  She is a 33 yo married P2 (33 and 45 yo sons) This is a planned pregnancy. She is at Unknown gestation. Her obstetrical history is significant for previous c/s x 2, h/o pre eclampsia with the first baby.. Relationship with FOB: spouse, living together. Patient does intend to breast feed. Pregnancy history fully reviewed.  Patient reports no complaints.  Review of Systems:   Review of Systems  Objective:     There were no vitals taken for this visit. Physical Exam  Exam Breathing, conversing, and ambulating normally Well nourished, well hydrated Black female, no apparent distress Heart- rrr Lungs- CTAB Abd- benign, scars noted from cesarean section Cervix- normal appearance   Assessment:    Pregnancy: No obstetric history on file. Patient Active Problem List   Diagnosis Date Noted  . Amenorrhea 08/13/2018  . Missed menses 07/17/2018  . Pain with urination 07/17/2018       Plan:     Initial labs drawn. Prenatal vitamins. Problem list reviewed and updated. NIPS/Horizon desired Role of ultrasound in pregnancy discussed; fetal survey: ordered. I did a bedside visualization with the iPad and could see a fluid collection in the uterus but no fetus. I have ordered an official radiology u/s for viability Pap smear done today    Emily Filbert 01/11/2019

## 2019-01-12 LAB — CYTOLOGY - PAP
Adequacy: ABSENT
Comment: NEGATIVE
Diagnosis: NEGATIVE
High risk HPV: NEGATIVE

## 2019-01-15 ENCOUNTER — Encounter

## 2019-01-18 ENCOUNTER — Other Ambulatory Visit: Payer: Self-pay

## 2019-01-18 ENCOUNTER — Encounter: Payer: Self-pay | Admitting: Obstetrics and Gynecology

## 2019-01-18 ENCOUNTER — Ambulatory Visit (HOSPITAL_COMMUNITY)
Admission: RE | Admit: 2019-01-18 | Discharge: 2019-01-18 | Disposition: A | Discharge status: Home / Self Care | Payer: BC Managed Care – PPO | Source: Ambulatory Visit | Attending: Obstetrics & Gynecology | Admitting: Obstetrics & Gynecology

## 2019-01-18 ENCOUNTER — Ambulatory Visit (INDEPENDENT_AMBULATORY_CARE_PROVIDER_SITE_OTHER): Payer: BC Managed Care – PPO | Admitting: Obstetrics and Gynecology

## 2019-01-18 DIAGNOSIS — Z3200 Encounter for pregnancy test, result unknown: Secondary | ICD-10-CM | POA: Insufficient documentation

## 2019-01-18 DIAGNOSIS — O039 Complete or unspecified spontaneous abortion without complication: Secondary | ICD-10-CM | POA: Insufficient documentation

## 2019-01-18 DIAGNOSIS — O021 Missed abortion: Secondary | ICD-10-CM | POA: Diagnosis not present

## 2019-01-18 NOTE — Addendum Note (Signed)
Addended by: Diona Foley on: 01/18/2019 11:39 AM   Modules accepted: Orders

## 2019-01-18 NOTE — Patient Instructions (Signed)

## 2019-01-18 NOTE — Progress Notes (Signed)
Pt here for U/S follow up. OB U/S today, missed AB at 8 w 1d U/S resul;ts reviewed with pt Pt denies vaginal bleeding or cramps  [id number] H/O c section x 2  Denies chronic medical problems or medications  PE AF VSS Lungs clear Heart RRR Abd soft + BS  A/P Missed AB  Tx options reviewed with pt. Pt undecided at this time. Instructed to call office back with choice. Will check CBC and blood type today F/U appt in 2 weeks

## 2019-01-19 ENCOUNTER — Telehealth: Payer: Self-pay | Admitting: General Practice

## 2019-01-19 LAB — CBC
Hematocrit: 38.7 % (ref 34.0–46.6)
Hemoglobin: 13.1 g/dL (ref 11.1–15.9)
MCH: 28.4 pg (ref 26.6–33.0)
MCHC: 33.9 g/dL (ref 31.5–35.7)
MCV: 84 fL (ref 79–97)
Platelets: 282 10*3/uL (ref 150–450)
RBC: 4.61 x10E6/uL (ref 3.77–5.28)
RDW: 12.7 % (ref 11.7–15.4)
WBC: 5.8 10*3/uL (ref 3.4–10.8)

## 2019-01-19 LAB — ABO/RH: Rh Factor: POSITIVE

## 2019-01-19 NOTE — Telephone Encounter (Signed)
Patient called into office stating she saw Dr Rip Harbour yesterday for a miscarriage and he discussed options with her and wanted her to call back with her decision. Patient states she has decided to wait. Told patient that is fine and we will plan on following up with her on 11/19 as currently planned. Advised she call us back if she has questions/concerns. Patient verbalized understanding & had no questions.

## 2019-01-21 ENCOUNTER — Telehealth: Payer: Self-pay | Admitting: *Deleted

## 2019-01-21 ENCOUNTER — Other Ambulatory Visit: Payer: Self-pay

## 2019-01-21 ENCOUNTER — Inpatient Hospital Stay (HOSPITAL_COMMUNITY)
Admission: EM | Admit: 2019-01-21 | Discharge: 2019-01-21 | Disposition: A | Discharge status: Home / Self Care | Payer: BC Managed Care – PPO | Attending: Obstetrics & Gynecology | Admitting: Obstetrics & Gynecology

## 2019-01-21 ENCOUNTER — Inpatient Hospital Stay (HOSPITAL_COMMUNITY): Payer: BC Managed Care – PPO

## 2019-01-21 ENCOUNTER — Encounter (HOSPITAL_COMMUNITY): Payer: Self-pay | Admitting: *Deleted

## 2019-01-21 DIAGNOSIS — O021 Missed abortion: Secondary | ICD-10-CM | POA: Diagnosis present

## 2019-01-21 DIAGNOSIS — Z3A11 11 weeks gestation of pregnancy: Secondary | ICD-10-CM | POA: Diagnosis not present

## 2019-01-21 DIAGNOSIS — O0289 Other abnormal products of conception: Secondary | ICD-10-CM | POA: Diagnosis not present

## 2019-01-21 DIAGNOSIS — O34219 Maternal care for unspecified type scar from previous cesarean delivery: Secondary | ICD-10-CM | POA: Insufficient documentation

## 2019-01-21 DIAGNOSIS — N939 Abnormal uterine and vaginal bleeding, unspecified: Secondary | ICD-10-CM

## 2019-01-21 LAB — CBC
HCT: 37.3 % (ref 36.0–46.0)
Hemoglobin: 12.5 g/dL (ref 12.0–15.0)
MCH: 28.4 pg (ref 26.0–34.0)
MCHC: 33.5 g/dL (ref 30.0–36.0)
MCV: 84.8 fL (ref 80.0–100.0)
Platelets: 260 10*3/uL (ref 150–400)
RBC: 4.4 MIL/uL (ref 3.87–5.11)
RDW: 12.3 % (ref 11.5–15.5)
WBC: 7.6 10*3/uL (ref 4.0–10.5)
nRBC: 0 % (ref 0.0–0.2)

## 2019-01-21 LAB — HCG, QUANTITATIVE, PREGNANCY: hCG, Beta Chain, Quant, S: 2159 m[IU]/mL — ABNORMAL HIGH (ref <5)

## 2019-01-21 MED ORDER — MISOPROSTOL 200 MCG PO TABS: ORAL_TABLET | ORAL | 0 refills | Status: DC

## 2019-01-21 MED ORDER — PROMETHAZINE HCL 25 MG PO TABS: 12.5000 mg | ORAL_TABLET | Freq: Four times a day (QID) | ORAL | 0 refills | Status: DC | PRN

## 2019-01-21 MED ORDER — IBUPROFEN 600 MG PO TABS: 600.0000 mg | ORAL_TABLET | Freq: Four times a day (QID) | ORAL | 3 refills | Status: DC | PRN

## 2019-01-21 MED ORDER — HYDROCODONE-ACETAMINOPHEN 5-325 MG PO TABS: 1.0000 | ORAL_TABLET | ORAL | 0 refills | Status: AC | PRN

## 2019-01-21 MED ORDER — OXYCODONE-ACETAMINOPHEN 5-325 MG PO TABS
1.0000 | ORAL_TABLET | Freq: Once | ORAL | Status: AC
  Administered 2019-01-21: 1 via ORAL
  Filled 2019-01-21: qty 1

## 2019-01-21 NOTE — ED Triage Notes (Signed)
Sees Dr.Evan Legrand [place])

## 2019-01-21 NOTE — ED Triage Notes (Signed)
Called [facility]  Transported by ED staff

## 2019-01-21 NOTE — Discharge Instructions (Signed)
Miscarriage °A miscarriage is the loss of an unborn baby (fetus) before the 20th week of pregnancy. °Follow these instructions at home: °Medicines ° °· Take over-the-counter and prescription medicines only as told by your doctor. °· If you were prescribed antibiotic medicine, take it as told by your doctor. Do not stop taking the antibiotic even if you start to feel better. °· Do not take NSAIDs unless your doctor says that this is safe for you. NSAIDs include aspirin and ibuprofen. These medicines can cause bleeding. °Activity °· Rest as directed. Ask your doctor what activities are safe for you. °· Have someone help you at home during this time. °General instructions °· Write down how many pads you use each day and how soaked they are. °· Watch the amount of tissue or clumps of blood (blood clots) that you pass from your vagina. Save any large amounts of tissue for your doctor. °· Do not use tampons, douche, or have sex until your doctor approves. °· To help you and your partner with the process of grieving, talk with your doctor or seek counseling. °· When you are ready, meet with your doctor to talk about steps you should take for your health. Also, talk with your doctor about steps to take to have a healthy pregnancy in the future. °· Keep all follow-up visits as told by your doctor. This is important. °Contact a doctor if: °· You have a fever or chills. °· You have vaginal discharge that smells bad. °· You have more bleeding. °Get help right away if: °· You have very bad cramps or pain in your back or belly. °· You pass clumps of blood that are walnut-sized or larger from your vagina. °· You pass tissue that is walnut-sized or larger from your vagina. °· You soak more than 1 regular pad in an hour. °· You get light-headed or weak. °· You faint (pass out). °· You have feelings of sadness that do not go away, or you have thoughts of hurting yourself. °Summary °· A miscarriage is the loss of an unborn baby before  the 20th week of pregnancy. °· Follow your doctor's instructions for home care. Keep all follow-up appointments. °· To help you and your partner with the process of grieving, talk with your doctor or seek counseling. °This information is not intended to replace advice given to you by your health care provider. Make sure you discuss any questions you have with your health care provider. °Document Released: 05/27/2011 Document Revised: 06/26/2018 Document Reviewed: 04/09/2016 °Elsevier Patient Education © 2020 Elsevier Inc. ° °

## 2019-01-21 NOTE — MAU Note (Addendum)
Presents with c/o abdominal pain and VB that began this afternoon.  Reports last intercourse 01/20/2019.  LMP 11/02/2018.  Reports saw MD a few days ago & has a failed pregnancy

## 2019-01-21 NOTE — ED Triage Notes (Signed)
Pt/cousin/translator stated, I started bleeding with stomach pain this morning. Changing her pad as much as every 5 min.

## 2019-01-21 NOTE — Telephone Encounter (Signed)
[name] called front desk and call transferred to nurse . She reports bleeding and pregnant. Per chart had a missed ab and has follow up scheduled 02/04/19.  She was given options and decided to watch and wait.  She reports started bleeding today and is dark blood; denies heavy bleeding, denies seeing any tissue, c/o cramping. She c/o not sure what to do. I explained that since she is having a miscarriage it is normal for her to bleed and cramp. That she does not need to do anything- her body is trying to release the tissue and it takes some time. I explained she may bleed for a few days or even until she has her fu appt. Or she may not bleed much.  I instructed her to go to the hospital if she has heavy bleeding or severe pain. I encouraged her to call family and friends for support. She states she is calling her Mother and voices understanding.  Gustavo Meditz,RN

## 2019-01-21 NOTE — MAU Provider Note (Signed)
History     CSN: [id number]  Arrival date and time: 01/21/19 1623   First Provider Initiated Contact with Patient 01/21/19 1716      Chief Complaint  Patient presents with  . Abdominal Pain  . Vaginal Bleeding   HPI Meagan Lee is a 33 y.o. [id number] with who presents to MAU with chief complaints of abdominal pain and vaginal bleeding in the setting of previously diagnosed pregnancy failure. These are new complaints, onset this afternoon. Patient's pain score is 10/10. Her pain is located bilaterally in her lower abdomen. She denies aggravating or alleviating factors. She has not taken medication for this complaint.  She was counseled about options for management of [facility] Dr. Alysia PennaErvin on 01/18/19. She elected expectant management at that time.   She denies abdominal tenderness, weakness, syncope, fever or recent illness.  OB History    Gravida  3   Para  2   Term  2   Preterm      AB      Living  2     SAB      TAB      Ectopic      Multiple      Live Births  2           History reviewed. No pertinent past medical history.  Past Surgical History:  Procedure Laterality Date  . CESAREAN SECTION      Family History  Problem Relation Age of Onset  . Hypertension Maternal Grandmother   . Asthma Maternal Grandfather   . Hypertension Paternal Aunt   . Hypertension Paternal Uncle     Social History   Tobacco Use  . Smoking status: Never Smoker  . Smokeless tobacco: Never Used  Substance Use Topics  . Alcohol use: No  . Drug use: No    Allergies: No Known Allergies  Medications Prior to Admission  Medication Sig Dispense Refill Last Dose  . Prenatal Vit-Fe Fumarate-FA (PRENATAL VITAMIN PLUS LOW IRON) 27-1 MG TABS Take 1 tablet by mouth daily.   01/20/2019 at 1200    Review of Systems  Constitutional: Negative for fatigue and fever.  Respiratory: Negative for shortness of breath.   Gastrointestinal: Positive for  abdominal pain.  Genitourinary: Positive for vaginal bleeding. Negative for difficulty urinating and pelvic pain.  Musculoskeletal: Negative for back pain.  Neurological: Negative for dizziness, syncope and weakness.  All other systems reviewed and are negative.  Physical Exam   Blood pressure (!) 126/56, pulse 79, temperature 98 F (36.7 C), temperature source Oral, resp. rate 20, last menstrual period 11/02/2018, SpO2 100 %.  Physical Exam  Nursing note and vitals reviewed. Constitutional: She is oriented to person, place, and time. She appears well-developed and well-nourished.  Cardiovascular: Normal rate.  Respiratory: Effort normal and breath sounds normal.  GI: Soft. She exhibits no distension. There is no abdominal tenderness. There is no rebound, no guarding and no CVA tenderness.  Genitourinary:    Vaginal discharge present.     Genitourinary Comments: Moderate amount of dark red bleeding. Sterile speculum exam attempted. Patient unable to tolerate insertion of spec.   Neurological: She is alert and oriented to person, place, and time.  Skin: Skin is warm and dry.  Psychiatric: She has a normal mood and affect. Her behavior is normal. Judgment and thought content normal.    MAU Course/MDM  Procedures: sterile speculum exam, ultrasound  --Patient unable to tolerate sterile speculum exam, unable to tolerate  TVUS --Patient denies pain prior to discharge --Patient continues to desire expectant management. Discussed that she may be at the end of most painful period of miscarrying. Discussed with Dr. Jolayne Panther, rx Cytotec and accompanying medications sent to pharmacy. Patient plans to fill prescriptions if she still feels unwell tomorrow morning --Significant review of concerning symptoms, timeline for expectant management vs Cytotec reviewed with patient and support person --Patient advised to have clinic follow-up in one week  Patient Vitals for the past 24 hrs:  BP Temp Temp  src Pulse Resp SpO2  01/21/19 2006 119/61 97.8 F (36.6 C) Oral 82 16 -  01/21/19 1700 (!) 126/56 98 F (36.7 C) Oral 79 20 100 %   Results for orders placed or performed during the hospital encounter of 01/21/19 (from the past 24 hour(s))  CBC     Status: None   Collection Time: 01/21/19  5:43 PM  Result Value Ref Range   WBC 7.6 4.0 - 10.5 K/uL   RBC 4.40 3.87 - 5.11 MIL/uL   Hemoglobin 12.5 12.0 - 15.0 g/dL   HCT 16.1 09.6 - 04.5 %   MCV 84.8 80.0 - 100.0 fL   MCH 28.4 26.0 - 34.0 pg   MCHC 33.5 30.0 - 36.0 g/dL   RDW 40.9 81.1 - 91.4 %   Platelets 260 150 - 400 K/uL   nRBC 0.0 0.0 - 0.2 %  hCG, quantitative, pregnancy     Status: Abnormal   Collection Time: 01/21/19  5:43 PM  Result Value Ref Range   hCG, Beta Chain, Quant, S 2,159 (H) <5 mIU/mL   US Ob Less Than 14 Weeks With Ob Transvaginal  Result Date: 01/21/2019 CLINICAL DATA:  33 year old pregnant female with pelvic pain and vaginal bleeding today. Patient with failed pregnancy diagnosed on 01/18/2019 ultrasound EXAM: OBSTETRIC <14 WK [place] AND TRANSVAGINAL OB US TECHNIQUE: Both transabdominal and transvaginal ultrasound examinations were performed for complete evaluation of the gestation as well as the maternal uterus, adnexal regions, and pelvic cul-de-sac. Transvaginal technique was performed to assess early pregnancy. COMPARISON:  None. FINDINGS: Intrauterine gestational sac: Single but now to be present within the LOWER uterus Yolk sac:  Not Visualized. Embryo:  Not Visualized. Subchorionic hemorrhage:  None visualized. Maternal uterus/adnexae: Ovaries not visualized. No adnexal mass or free fluid. IMPRESSION: Intrauterine gestational sac now present within the LOWER uterus and without embryo or yolk sac, compatible with nonviable pregnancy. No acute abnormalities identified. Electronically Signed   By: Harmon Pier M.D.   On: 01/21/2019 19:32   Early Intrauterine Pregnancy Failure  X Documented intrauterine pregnancy  failure less than or equal to [redacted] weeks gestation  X  No serious current illness  X Baseline Hgb greater than or equal to 10g/dl  X  Patient has easily accessible transportation to the hospital  X Clear preference  X Practitioner/physician deems patient reliable  X Counseling by practitioner or physician  X  Patient education by RN  (N/A) Rho-Gam given by RN if indicated (Blood type O POS)  X  Medication dispensed -->Cytotec 800 mcg (Intravaginally by patient at home) -->Ibuprofen 600 mg 1 tablet by mouth every 6 hours as needed #30 -->Hydrocodone/acetaminophen 5/325 mg by mouth every 4 to 6 hours as needed -->Phenergan 12.5 mg by mouth every 4 hours as needed for nausea   Meds ordered this encounter  Medications  . oxyCODONE-acetaminophen (PERCOCET/ROXICET) 5-325 MG per tablet 1 tablet  . misoprostol (CYTOTEC) 200 MCG tablet    Sig: Insert four tablets  vaginally    Dispense:  4 tablet    Refill:  0    Order Specific Question:   Supervising Provider    Answer:   CONSTANT, PEGGY [[id number]]  . ibuprofen (ADVIL) 600 MG tablet    Sig: Take 1 tablet (600 mg total) by mouth every 6 (six) hours as needed.    Dispense:  60 tablet    Refill:  3    Order Specific Question:   Supervising Provider    Answer:   Woodroe Mode [[id number]]  . HYDROcodone-acetaminophen (NORCO/VICODIN) 5-325 MG tablet    Sig: Take 1 tablet by mouth every 4 (four) hours as needed for up to 3 days for severe pain.    Dispense:  18 tablet    Refill:  0    Order Specific Question:   Supervising Provider    Answer:   CONSTANT, PEGGY [[id number]]  . promethazine (PHENERGAN) 25 MG tablet    Sig: Take 0.5 tablets (12.5 mg total) by mouth every 6 (six) hours as needed for nausea or vomiting.    Dispense:  30 tablet    Refill:  0    Order Specific Question:   Supervising Provider    Answer:   Woodroe Mode [[id number]]    Assessment and Plan  --33 y.o. G3P2002 at [redacted]w[redacted]d  --Pregnancy failure, GS now in lower uterine  segment --Pain, bleeding significantly improved prior to discharge --Rx Cytotec to patient's pharmacy, patient intends to administer in the morning --Discharge home in stable condition  F/U: --Clinic appointment in one week. May be virtual per Dr. Mervin Hack, CNM 01/21/2019, 8:15 PM

## 2019-01-27 ENCOUNTER — Other Ambulatory Visit: Payer: Self-pay

## 2019-01-27 ENCOUNTER — Telehealth (INDEPENDENT_AMBULATORY_CARE_PROVIDER_SITE_OTHER): Payer: BC Managed Care – PPO | Admitting: Obstetrics & Gynecology

## 2019-01-27 DIAGNOSIS — O021 Missed abortion: Secondary | ICD-10-CM

## 2019-01-27 NOTE — Patient Instructions (Signed)
Miscarriage °A miscarriage is the loss of an unborn baby (fetus) before the 20th week of pregnancy. °Follow these instructions at home: °Medicines ° °· Take over-the-counter and prescription medicines only as told by your doctor. °· If you were prescribed antibiotic medicine, take it as told by your doctor. Do not stop taking the antibiotic even if you start to feel better. °· Do not take NSAIDs unless your doctor says that this is safe for you. NSAIDs include aspirin and ibuprofen. These medicines can cause bleeding. °Activity °· Rest as directed. Ask your doctor what activities are safe for you. °· Have someone help you at home during this time. °General instructions °· Write down how many pads you use each day and how soaked they are. °· Watch the amount of tissue or clumps of blood (blood clots) that you pass from your vagina. Save any large amounts of tissue for your doctor. °· Do not use tampons, douche, or have sex until your doctor approves. °· To help you and your partner with the process of grieving, talk with your doctor or seek counseling. °· When you are ready, meet with your doctor to talk about steps you should take for your health. Also, talk with your doctor about steps to take to have a healthy pregnancy in the future. °· Keep all follow-up visits as told by your doctor. This is important. °Contact a doctor if: °· You have a fever or chills. °· You have vaginal discharge that smells bad. °· You have more bleeding. °Get help right away if: °· You have very bad cramps or pain in your back or belly. °· You pass clumps of blood that are walnut-sized or larger from your vagina. °· You pass tissue that is walnut-sized or larger from your vagina. °· You soak more than 1 regular pad in an hour. °· You get light-headed or weak. °· You faint (pass out). °· You have feelings of sadness that do not go away, or you have thoughts of hurting yourself. °Summary °· A miscarriage is the loss of an unborn baby before  the 20th week of pregnancy. °· Follow your doctor's instructions for home care. Keep all follow-up appointments. °· To help you and your partner with the process of grieving, talk with your doctor or seek counseling. °This information is not intended to replace advice given to you by your health care provider. Make sure you discuss any questions you have with your health care provider. °Document Released: 05/27/2011 Document Revised: 06/26/2018 Document Reviewed: 04/09/2016 °Elsevier Patient Education © 2020 [employer] ° °

## 2019-01-27 NOTE — Progress Notes (Signed)
Patient ID: Meagan Lee, female   DOB: 03-04-1986, 33 y.o.      TELEHEALTH VIRTUAL GYNECOLOGY VISIT ENCOUNTER NOTE  I connected with Meagan Lee on 01/27/19 at  3:35 PM EST by telephone at home and verified that I am speaking with the correct person using two identifiers.   I discussed the limitations, risks, security and privacy concerns of performing an evaluation and management service by telephone and the availability of in person appointments. I also discussed with the patient that there may be a patient responsible charge related to this service. The patient expressed understanding and agreed to proceed.   History:  Meagan Lee is a 33 y.o. G24P2002 female being evaluated today for f/u after diagnosis of missed abortion by [place] on 11/6. After taking Cytotec she passed tissue and her bleeding is not heavy now. She requests a f/u US to confirm completion after treatment for missed ab. [place] results were reviewed from 11/5      No past medical history on file. Past Surgical History:  Procedure Laterality Date   CESAREAN SECTION     The following portions of the patient's history were reviewed and updated as appropriate: allergies, current medications, past family history, past medical history, past social history, past surgical history and problem list.   Health Maintenance:  Normal pap and negative HRHPV on 01/11/2019.    Review of Systems:  Pertinent items noted in HPI and remainder of comprehensive ROS otherwise negative.  Physical Exam:   General:  Alert, oriented and cooperative.   Mental Status: Normal mood and affect perceived. Normal judgment and thought content.  Physical exam deferred due to nature of the encounter  Labs and Imaging Results for orders placed or performed during the hospital encounter of 01/21/19 (from the past 336 hour(s))  CBC   Collection Time: 01/21/19  5:43 PM  Result Value Ref Range   WBC 7.6 4.0 - 10.5 K/uL   RBC 4.40 3.87 - 5.11 MIL/uL   Hemoglobin 12.5 12.0 - 15.0 g/dL   HCT 37.3 36.0 - 46.0 %   MCV 84.8 80.0 - 100.0 fL   MCH 28.4 26.0 - 34.0 pg   MCHC 33.5 30.0 - 36.0 g/dL   RDW 12.3 11.5 - 15.5 %   Platelets 260 150 - 400 K/uL   nRBC 0.0 0.0 - 0.2 %  hCG, quantitative, pregnancy   Collection Time: 01/21/19  5:43 PM  Result Value Ref Range   hCG, Beta Chain, Quant, S 2,159 (H) <5 mIU/mL  Results for orders placed or performed in visit on 01/18/19 (from the past 336 hour(s))  CBC   Collection Time: 01/18/19 12:14 PM  Result Value Ref Range   WBC 5.8 3.4 - 10.8 x10E3/uL   RBC 4.61 3.77 - 5.28 x10E6/uL   Hemoglobin 13.1 11.1 - 15.9 g/dL   Hematocrit 38.7 34.0 - 46.6 %   MCV 84 79 - 97 fL   MCH 28.4 26.6 - 33.0 pg   MCHC 33.9 31.5 - 35.7 g/dL   RDW 12.7 11.7 - 15.4 %   Platelets 282 150 - 450 x10E3/uL  ABO/Rh   Collection Time: 01/18/19 12:14 PM  Result Value Ref Range   ABO Grouping O    Rh Factor Positive    US Ob Less Than 14 Weeks With Ob Transvaginal  Result Date: 01/21/2019 CLINICAL DATA:  33 year old pregnant female with pelvic pain and vaginal bleeding today. Patient with failed pregnancy diagnosed on 01/18/2019 ultrasound EXAM: OBSTETRIC <14 WK [place] AND  TRANSVAGINAL OB US TECHNIQUE: Both transabdominal and transvaginal ultrasound examinations were performed for complete evaluation of the gestation as well as the maternal uterus, adnexal regions, and pelvic cul-de-sac. Transvaginal technique was performed to assess early pregnancy. COMPARISON:  None. FINDINGS: Intrauterine gestational sac: Single but now to be present within the LOWER uterus Yolk sac:  Not Visualized. Embryo:  Not Visualized. Subchorionic hemorrhage:  None visualized. Maternal uterus/adnexae: Ovaries not visualized. No adnexal mass or free fluid. IMPRESSION: Intrauterine gestational sac now present within the LOWER uterus and without embryo or yolk sac, compatible with nonviable pregnancy. No acute abnormalities identified. Electronically Signed    By: Harmon Pier M.D.   On: 01/21/2019 19:32   US Ob Less Than 14 Weeks With Ob Transvaginal  Result Date: 01/18/2019 CLINICAL DATA:  Evaluate viability.  Dating. EXAM: OBSTETRIC <14 WK [place] AND TRANSVAGINAL OB US TECHNIQUE: Both transabdominal and transvaginal ultrasound examinations were performed for complete evaluation of the gestation as well as the maternal uterus, adnexal regions, and pelvic cul-de-sac. Transvaginal technique was performed to assess early pregnancy. COMPARISON:  None FINDINGS: Intrauterine gestational sac: Single Yolk sac:  No Embryo:  Yes Cardiac Activity: No Heart Rate: None bpm CRL:  17.7 mm   8 w   1 d Subchorionic hemorrhage:  None visualized. Maternal uterus/adnexae: Right ovary: Normal Left ovary: Normal Other :None Free fluid:  None IMPRESSION: 1. 17.7 mm fetal pole without cardiac activity. Findings meet definitive criteria for failed pregnancy. This follows SRU consensus guidelines: Diagnostic Criteria for Nonviable Pregnancy Early in the First Trimester. [facility] [phone]. Electronically Signed   By: Signa Kell M.D.   On: 01/18/2019 10:50      Assessment and Plan:     1. Missed ab F/u for completion by her request - US PELVIC COMPLETE WITH TRANSVAGINAL; Future       I discussed the assessment and treatment plan with the patient. The patient was provided an opportunity to ask questions and all were answered. The patient agreed with the plan and demonstrated an understanding of the instructions.   The patient was advised to call back or seek an in-person evaluation/go to the ED if the symptoms worsen or if the condition fails to improve as anticipated.  I provided 12 minutes of non-face-to-face time during this encounter.   Scheryl Darter, MD [facility], [facility]

## 2019-01-27 NOTE — Progress Notes (Signed)
I connected with  Tollie Pizza on 01/27/19 at  3:35 PM EST by telephone and verified that I am speaking with the correct person using two identifiers.   I discussed the limitations, risks, security and privacy concerns of performing an evaluation and management service by telephone and the availability of in person appointments. I also discussed with the patient that there may be a patient responsible charge related to this service. The patient expressed understanding and agreed to proceed.  Bethanne Ginger, CMA 01/27/2019  3:38 PM

## 2019-01-29 ENCOUNTER — Encounter: Payer: PRIVATE HEALTH INSURANCE | Admitting: Obstetrics & Gynecology

## 2019-02-04 ENCOUNTER — Ambulatory Visit: Payer: Medicaid Other | Admitting: Obstetrics and Gynecology

## 2019-02-04 ENCOUNTER — Other Ambulatory Visit: Payer: Self-pay

## 2019-02-09 ENCOUNTER — Other Ambulatory Visit: Payer: Self-pay | Admitting: Obstetrics & Gynecology

## 2019-02-09 ENCOUNTER — Other Ambulatory Visit: Payer: Self-pay

## 2019-02-09 ENCOUNTER — Ambulatory Visit (HOSPITAL_COMMUNITY)
Admission: RE | Admit: 2019-02-09 | Discharge: 2019-02-09 | Disposition: A | Discharge status: Home / Self Care | Payer: BC Managed Care – PPO | Source: Ambulatory Visit | Attending: Obstetrics & Gynecology | Admitting: Obstetrics & Gynecology

## 2019-02-09 DIAGNOSIS — O021 Missed abortion: Secondary | ICD-10-CM

## 2019-02-18 ENCOUNTER — Other Ambulatory Visit: Payer: Self-pay

## 2019-02-18 ENCOUNTER — Ambulatory Visit (INDEPENDENT_AMBULATORY_CARE_PROVIDER_SITE_OTHER): Payer: BC Managed Care – PPO | Admitting: Obstetrics and Gynecology

## 2019-02-18 ENCOUNTER — Encounter: Payer: Self-pay | Admitting: Obstetrics and Gynecology

## 2019-02-18 VITALS — BP 124/77 | HR 94 | Ht 64.0 in | Wt 177.7 lb

## 2019-02-18 DIAGNOSIS — O021 Missed abortion: Secondary | ICD-10-CM

## 2019-02-18 NOTE — Patient Instructions (Signed)
Health Maintenance, Female Adopting a healthy lifestyle and getting preventive care are important in promoting health and wellness. Ask your health care provider about:  The right schedule for you to have regular tests and exams.  Things you can do on your own to prevent diseases and keep yourself healthy. What should I know about diet, weight, and exercise? Eat a healthy diet   Eat a diet that includes plenty of vegetables, fruits, low-fat dairy products, and lean protein.  Do not eat a lot of foods that are high in solid fats, added sugars, or sodium. Maintain a healthy weight Body mass index (BMI) is used to identify weight problems. It estimates body fat based on height and weight. Your health care provider can help determine your BMI and help you achieve or maintain a healthy weight. Get regular exercise Get regular exercise. This is one of the most important things you can do for your health. Most adults should:  Exercise for at least 150 minutes each week. The exercise should increase your heart rate and make you sweat (moderate-intensity exercise).  Do strengthening exercises at least twice a week. This is in addition to the moderate-intensity exercise.  Spend less time sitting. Even light physical activity can be beneficial. Watch cholesterol and blood lipids Have your blood tested for lipids and cholesterol at 33 years of age, then have this test every 5 years. Have your cholesterol levels checked more often if:  Your lipid or cholesterol levels are high.  You are older than 33 years of age.  You are at high risk for heart disease. What should I know about cancer screening? Depending on your health history and family history, you may need to have cancer screening at various ages. This may include screening for:  Breast cancer.  Cervical cancer.  Colorectal cancer.  Skin cancer.  Lung cancer. What should I know about heart disease, diabetes, and high blood  pressure? Blood pressure and heart disease  High blood pressure causes heart disease and increases the risk of stroke. This is more likely to develop in people who have high blood pressure readings, are of African descent, or are overweight.  Have your blood pressure checked: ? Every 3-5 years if you are 18-39 years of age. ? Every year if you are 40 years old or older. Diabetes Have regular diabetes screenings. This checks your fasting blood sugar level. Have the screening done:  Once every three years after age 40 if you are at a normal weight and have a low risk for diabetes.  More often and at a younger age if you are overweight or have a high risk for diabetes. What should I know about preventing infection? Hepatitis B If you have a higher risk for hepatitis B, you should be screened for this virus. Talk with your health care provider to find out if you are at risk for hepatitis B infection. Hepatitis C Testing is recommended for:  Everyone born from 1945 through 1965.  Anyone with known risk factors for hepatitis C. Sexually transmitted infections (STIs)  Get screened for STIs, including gonorrhea and chlamydia, if: ? You are sexually active and are younger than 33 years of age. ? You are older than 33 years of age and your health care provider tells you that you are at risk for this type of infection. ? Your sexual activity has changed since you were last screened, and you are at increased risk for chlamydia or gonorrhea. Ask your health care provider if   you are at risk.  Ask your health care provider about whether you are at high risk for HIV. Your health care provider may recommend a prescription medicine to help prevent HIV infection. If you choose to take medicine to prevent HIV, you should first get tested for HIV. You should then be tested every 3 months for as long as you are taking the medicine. Pregnancy  If you are about to stop having your period (premenopausal) and  you may become pregnant, seek counseling before you get pregnant.  Take 400 to 800 micrograms (mcg) of folic acid every day if you become pregnant.  Ask for birth control (contraception) if you want to prevent pregnancy. Osteoporosis and menopause Osteoporosis is a disease in which the bones lose minerals and strength with aging. This can result in bone fractures. If you are 65 years old or older, or if you are at risk for osteoporosis and fractures, ask your health care provider if you should:  Be screened for bone loss.  Take a calcium or vitamin D supplement to lower your risk of fractures.  Be given hormone replacement therapy (HRT) to treat symptoms of menopause. Follow these instructions at home: Lifestyle  Do not use any products that contain nicotine or tobacco, such as cigarettes, e-cigarettes, and chewing tobacco. If you need help quitting, ask your health care provider.  Do not use street drugs.  Do not share needles.  Ask your health care provider for help if you need support or information about quitting drugs. Alcohol use  Do not drink alcohol if: ? Your health care provider tells you not to drink. ? You are pregnant, may be pregnant, or are planning to become pregnant.  If you drink alcohol: ? Limit how much you use to 0-1 drink a day. ? Limit intake if you are breastfeeding.  Be aware of how much alcohol is in your drink. In the [place] one drink equals one 12 oz bottle of beer (355 mL), one 5 oz glass of wine (148 mL), or one 1 oz glass of hard liquor (44 mL). General instructions  Schedule regular health, dental, and eye exams.  Stay current with your vaccines.  Tell your health care provider if: ? You often feel depressed. ? You have ever been abused or do not feel safe at home. Summary  Adopting a healthy lifestyle and getting preventive care are important in promoting health and wellness.  Follow your health care provider's instructions about healthy  diet, exercising, and getting tested or screened for diseases.  Follow your health care provider's instructions on monitoring your cholesterol and blood pressure. This information is not intended to replace advice given to you by your health care provider. Make sure you discuss any questions you have with your health care provider. Document Released: 09/17/2010 Document Revised: 02/25/2018 Document Reviewed: 02/25/2018 Elsevier Patient Education  2020 Elsevier Inc.  

## 2019-02-18 NOTE — Progress Notes (Signed)
Patient ID: Meagan Lee, female   DOB: 1985-08-29, 33 y.o.   MRN: [id number] [name] presents for follow up for SAB. She has completed her Cytotec. U/S verified complete AB No bleeding or pain. Declines contraception  PE AF VSS Lungs clear  Heart  RRR  A/P Completed SAB  Advised to reframe form attempting to conceive for 3 months. First trimester SAB reviewed with pt. Continue with PNV qd. F/U PRN

## 2019-02-18 NOTE — Progress Notes (Signed)
Pt states no bleeding 

## 2019-04-15 ENCOUNTER — Ambulatory Visit: Payer: Self-pay | Attending: Internal Medicine

## 2019-04-15 DIAGNOSIS — Z20822 Contact with and (suspected) exposure to covid-19: Secondary | ICD-10-CM | POA: Insufficient documentation

## 2019-04-16 LAB — NOVEL CORONAVIRUS, NAA: SARS-CoV-2, NAA: NOT DETECTED

## 2019-07-07 ENCOUNTER — Encounter: Payer: Self-pay | Admitting: Adult Health Nurse Practitioner

## 2019-07-07 ENCOUNTER — Ambulatory Visit (INDEPENDENT_AMBULATORY_CARE_PROVIDER_SITE_OTHER): Payer: BC Managed Care – PPO | Admitting: Adult Health Nurse Practitioner

## 2019-07-07 ENCOUNTER — Other Ambulatory Visit: Payer: Self-pay

## 2019-07-07 VITALS — BP 115/73 | HR 72 | Temp 98.0°F | Ht 64.0 in | Wt 179.0 lb

## 2019-07-07 DIAGNOSIS — O021 Missed abortion: Secondary | ICD-10-CM | POA: Diagnosis not present

## 2019-07-07 DIAGNOSIS — E049 Nontoxic goiter, unspecified: Secondary | ICD-10-CM

## 2019-07-07 DIAGNOSIS — N979 Female infertility, unspecified: Secondary | ICD-10-CM | POA: Insufficient documentation

## 2019-07-07 DIAGNOSIS — R5383 Other fatigue: Secondary | ICD-10-CM

## 2019-07-07 DIAGNOSIS — G4452 New daily persistent headache (NDPH): Secondary | ICD-10-CM

## 2019-07-07 DIAGNOSIS — N912 Amenorrhea, unspecified: Secondary | ICD-10-CM

## 2019-07-07 DIAGNOSIS — E559 Vitamin D deficiency, unspecified: Secondary | ICD-10-CM | POA: Insufficient documentation

## 2019-07-07 HISTORY — DX: Other fatigue: R53.83

## 2019-07-07 HISTORY — DX: New daily persistent headache (ndph): G44.52

## 2019-07-07 MED ORDER — RIZATRIPTAN BENZOATE 10 MG PO TABS: 10.0000 mg | ORAL_TABLET | ORAL | 0 refills | Status: DC | PRN

## 2019-07-07 NOTE — Patient Instructions (Signed)
° ° ° °  If you have lab work done today you will be contacted with your lab results within the next 2 weeks.  If you have not heard from us then please contact us. The fastest way to get your results is to register for My Chart. ° ° °IF you received an x-ray today, you will receive an invoice from [facility]. Please contact [facility] at [phone] with questions or concerns regarding your invoice.  ° °IF you received labwork today, you will receive an invoice from [facility]. Please contact [facility] at [phone] with questions or concerns regarding your invoice.  ° °Our billing staff will not be able to assist you with questions regarding bills from these companies. ° °You will be contacted with the lab results as soon as they are available. The fastest way to get your results is to activate your My Chart account. Instructions are located on the last page of this paperwork. If you have not heard from us regarding the results in 2 weeks, please contact this office. °  ° ° ° °

## 2019-07-07 NOTE — Progress Notes (Signed)
Chief Complaint  Patient presents with  . Migraine    Pt stated that she has had a headache for the past 3 weeks.    HPI  Patient is a pleasant 34 year old [place] female.  No interpreter was available for this visit.  Patient presents with a headache that has been going on for 21 to 30 days.  Reports that it starts in the morning when she wakes up and circles her head like a baseball cap.  It is present until she takes ibuprofen.  After she takes ibuprofen, the headache is resolved until the evening before she is supposed to go to bed where it starts again.  Ibuprofen completely resolves the headache but it returns the next day.  Discussion is somewhat challenging with some degree of language barrier.  Patient notes no changes in recent schedule.  Reports she sleeps normally from 8 until 6 every morning.  Sleeps well, does not wake up.  Denies any blurry vision, double vision, dizziness.  It is Ramadan so she is not eating during the day and is not drinking during the day but prior to this when the headache started she was eating and drinking regularly, 3 meals a day and plenty of water.  She has not tried any other medications.  She has not seen an op the neurologist.  She has had something similar to this a few years back but does not remember how it was treated.  Of note, she relates she has not had her period in 3 months.  She also has history of thyroid disease in her family.  A doctor told her that there was some concern for thyroid abnormality on palpation of the thyroid Grant gland.  She had a miscarriage in November.  She has 2 boys and would like a girl but has not had her.  In 3 months.  Has not seen a fertility specialist or undergone any lab work evaluation since her miscarriage in the fall.  She would like a referral to an infertility specialist and would like lab work to check her thyroid and blood work to evaluate any possible reasons of infertility.      Problem List     Problem List: 2020-11: Missed ab 2020-10: History of cesarean delivery 2020-10: History of pre-eclampsia in prior pregnancy, currently  pregnant 2020-05: Amenorrhea 2020-05: Missed menses 2020-05: Pain with urination   Allergies   has No Known Allergies.  Medications    Current Outpatient Medications:  Marland Kitchen  Multiple Vitamin (MULTIVITAMIN) tablet, Take 1 tablet by mouth daily., Disp: , Rfl:  .  rizatriptan (MAXALT) 10 MG tablet, Take 1 tablet (10 mg total) by mouth as needed for migraine. May repeat in 2 hours if needed, Disp: 10 tablet, Rfl: 0   Review of Systems    Review of Systems  Constitutional: Positive for malaise/fatigue. Negative for chills, fever and weight loss.  Eyes: Negative for blurred vision, double vision, photophobia and pain.  Cardiovascular: Negative.   Gastrointestinal: Positive for nausea. Negative for blood in stool and vomiting.  Genitourinary: Negative for hematuria.       Amenorrhea x 3 months since miscarriage.  Skin: Negative.   Neurological: Positive for headaches. Negative for dizziness, focal weakness and weakness.  Endo/Heme/Allergies: Negative for environmental allergies and polydipsia. Does not bruise/bleed easily.  Psychiatric/Behavioral: The patient is not nervous/anxious and does not have insomnia.       Physical Exam:    height is 5\' 4"  (1.626 m) and weight is 179  lb (81.2 kg). Her temporal temperature is 98 F (36.7 C). Her blood pressure is 115/73 and her pulse is 72. Her oxygen saturation is 99%.   Physical Examination: General appearance - alert, well appearing, and in no distress and oriented to person, place, and time Mental status - normal mood, behavior, speech, dress, motor activity, and thought processes Eyes - PERRL. Extraocular movements intact.  No nystagmus.  Neck - supple, no significant adenopathy, carotids upstroke normal bilaterally, thyromegaly to the left, unable to determine if discrete mass or generalized  enlargement.  Pulm:  CTA bilaterally Cardiac:  S1, S2, RRR with occasional ectopic beat.  MSK:  Swelling and muscle tension with spasm to the paraspinals/trapezius with dowager hump noted on exam.  Neuro:  CNII-XII grossly intact.   No hyperpigmentation of skin.  No current hematomas noted    Assessment & Plan:  Seleste Tallman is a 34 y.o. female    1. Amenorrhea   2. Missed ab   3. Vitamin D deficiency   4. Other fatigue   5. Infertility, female    Orders Placed This Encounter  Procedures  . CBC with Differential/Platelet  . Thyroid Panel With TSH  . Iron, TIBC and Ferritin Panel  . B12 and Folate Panel  . VITAMIN D 25 Hydroxy (Vit-D Deficiency, Fractures)   Meds ordered this encounter  Medications  . rizatriptan (MAXALT) 10 MG tablet    Sig: Take 1 tablet (10 mg total) by mouth as needed for migraine. May repeat in 2 hours if needed    Dispense:  10 tablet    Refill:  0   I have asked the patient to return in 1 week to review labs and determine next step.  I have referred her to reproductive endocrinology both for her thyroid, and menorrhea, thyromegaly and family history of thyroid disease.  She is in line with this plan.  We will follow-up in 1 week.  In the interim I gave her an abortive headache therapy but she does not need to use if Motrin is helping in the interim.  Offered a referral to neurology but patient declined.      Elyse Jarvis, NP

## 2019-07-08 LAB — CBC WITH DIFFERENTIAL/PLATELET
Basophils Absolute: 0 10*3/uL (ref 0.0–0.2)
Basos: 1 %
EOS (ABSOLUTE): 0.1 10*3/uL (ref 0.0–0.4)
Eos: 2 %
Hematocrit: 38.8 % (ref 34.0–46.6)
Hemoglobin: 12.8 g/dL (ref 11.1–15.9)
Immature Grans (Abs): 0 10*3/uL (ref 0.0–0.1)
Immature Granulocytes: 0 %
Lymphocytes Absolute: 2.7 10*3/uL (ref 0.7–3.1)
Lymphs: 49 %
MCH: 27.4 pg (ref 26.6–33.0)
MCHC: 33 g/dL (ref 31.5–35.7)
MCV: 83 fL (ref 79–97)
Monocytes Absolute: 0.6 10*3/uL (ref 0.1–0.9)
Monocytes: 10 %
Neutrophils Absolute: 2.1 10*3/uL (ref 1.4–7.0)
Neutrophils: 38 %
Platelets: 334 10*3/uL (ref 150–450)
RBC: 4.68 x10E6/uL (ref 3.77–5.28)
RDW: 12.8 % (ref 11.7–15.4)
WBC: 5.6 10*3/uL (ref 3.4–10.8)

## 2019-07-08 LAB — THYROID PANEL WITH TSH
Free Thyroxine Index: 2.2 (ref 1.2–4.9)
T3 Uptake Ratio: 27 % (ref 24–39)
T4, Total: 8.2 ug/dL (ref 4.5–12.0)
TSH: 1.53 u[IU]/mL (ref 0.450–4.500)

## 2019-07-08 LAB — VITAMIN D 25 HYDROXY (VIT D DEFICIENCY, FRACTURES): Vit D, 25-Hydroxy: 8.4 ng/mL — ABNORMAL LOW (ref 30.0–100.0)

## 2019-07-08 LAB — B12 AND FOLATE PANEL
Folate: 11.7 ng/mL (ref >3.0)
Vitamin B-12: 325 pg/mL (ref 232–1245)

## 2019-07-08 LAB — IRON,TIBC AND FERRITIN PANEL
Ferritin: 43 ng/mL (ref 15–150)
Iron Saturation: 15 % (ref 15–55)
Iron: 65 ug/dL (ref 27–159)
Total Iron Binding Capacity: 433 ug/dL (ref 250–450)
UIBC: 368 ug/dL (ref 131–425)

## 2019-07-19 ENCOUNTER — Ambulatory Visit (INDEPENDENT_AMBULATORY_CARE_PROVIDER_SITE_OTHER): Payer: BC Managed Care – PPO | Admitting: Family Medicine

## 2019-07-19 ENCOUNTER — Other Ambulatory Visit: Payer: Self-pay

## 2019-07-19 VITALS — BP 104/70 | HR 82 | Temp 98.1°F | Resp 15 | Ht 64.0 in | Wt 176.4 lb

## 2019-07-19 DIAGNOSIS — N912 Amenorrhea, unspecified: Secondary | ICD-10-CM | POA: Diagnosis not present

## 2019-07-19 DIAGNOSIS — G4452 New daily persistent headache (NDPH): Secondary | ICD-10-CM

## 2019-07-19 DIAGNOSIS — E559 Vitamin D deficiency, unspecified: Secondary | ICD-10-CM | POA: Diagnosis not present

## 2019-07-19 MED ORDER — ERGOCALCIFEROL 200 MCG/ML PO SOLN: 50000.0000 [IU] | ORAL | 3 refills | Status: DC

## 2019-07-19 NOTE — Patient Instructions (Addendum)
   If you have lab work done today you will be contacted with your lab results within the next 2 weeks.  If you have not heard from us then please contact us. The fastest way to get your results is to register for My Chart.   IF you received an x-ray today, you will receive an invoice from [facility]. Please contact [facility] at [phone] with questions or concerns regarding your invoice.   IF you received labwork today, you will receive an invoice from [facility]. Please contact [facility] at [phone] with questions or concerns regarding your invoice.   Our billing staff will not be able to assist you with questions regarding bills from these companies.  You will be contacted with the lab results as soon as they are available. The fastest way to get your results is to activate your My Chart account. Instructions are located on the last page of this paperwork. If you have not heard from us regarding the results in 2 weeks, please contact this office.     Primary Amenorrhea  Primary amenorrhea is when a female has not started having periods by the age of 15 years. What are the causes? This condition may be caused by:  An abnormal chromosome that causes the ovaries not to work properly (common).  Malnutrition.  Low blood sugar (hypoglycemia).  Polycystic ovary syndrome.  Being born without a vagina, uterus, or ovaries.  Extreme obesity.  Cystic fibrosis.  Drastic weight loss.  Over-exercising that leads to a loss of body fat.  Pituitary gland tumor in the brain.  Long-term illnesses.  Cushing disease.  A thyroid disease, such as hypothyroidism or hyperthyroidism.  A part of the brain called the hypothalamus not working normally.  Premature ovarian failure. What are the signs or symptoms? The main symptom of this condition is not having had a period by age 15 years. Other symptoms include:  Discharge from the breasts.  Hot  flashes.  Adult acne.  Facial or chest hair.  Headaches.  Impaired vision.  Recent excessive stress.  Changes in weight, diet, or exercise patterns. How is this diagnosed? This condition may be diagnosed based on:  Your medical history.  A physical exam.  Tests, such as: ? Blood tests. ? Urine tests. How is this treated? Treatment for this condition depends on the cause. For example, an absence of sex organs will require surgery to correct the problem. Others causes may respond when treated with medicine. Follow these instructions at home:  Maintain a healthy diet.  Maintain a healthy weight.  Exercise regularly but not excessively.  Take over-the-counter and prescription medicines only as told by your health care provider.  Keep all follow-up visits as told by your health care provider. This is important. Contact a health care provider if:  Your body is not developing at the level of your peers.  You have pelvic pain.  You gain an unusual amount of weight.  You have an unusual amount of hair growth. Summary  Primary amenorrhea is when a female has not started having periods by the age of 15 years.  This condition has many causes, including abnormal ovaries, obesity, extreme weight loss.  Contact a health care provider if your body is not developing at the level of your peers. This information is not intended to replace advice given to you by your health care provider. Make sure you discuss any questions you have with your health care provider. Document Revised: 08/18/2018 Document Reviewed: 05/21/2016   Elsevier Patient Education  2020 Elsevier Inc.  

## 2019-07-19 NOTE — Progress Notes (Signed)
Established Patient Office Visit  Subjective:  Patient ID: Meagan Lee, female    DOB: 1985/03/24  Age: 34 y.o. MRN: [id number]  CC:  Chief Complaint  Patient presents with  . Amenorrhea    pt is feeling well today no discomfort no concerns     HPI Meagan Lee presents for   Translator number [id number]  Migraines Her headaches are better and not as bad as they used to be. She states that the headache medication maxalt was used one time.  Amenorrhea Her period has not come back She states that her last period was March 1st.  She states that after her miscarriage she has not resumed her menstrual cycles.  She states that her menses were regular since January 2020.  She went to a specialist to check it out.  She has 2 boys.    No past medical history on file.  Past Surgical History:  Procedure Laterality Date  . CESAREAN SECTION      Family History  Problem Relation Age of Onset  . Hypertension Maternal Grandmother   . Asthma Maternal Grandfather   . Hypertension Paternal Aunt   . Hypertension Paternal Uncle     Social History   Socioeconomic History  . Marital status: Married    Spouse name: Not on file  . Number of children: Not on file  . Years of education: Not on file  . Highest education level: Not on file  Occupational History  . Not on file  Tobacco Use  . Smoking status: Never Smoker  . Smokeless tobacco: Never Used  Substance and Sexual Activity  . Alcohol use: No  . Drug use: No  . Sexual activity: Yes  Other Topics Concern  . Not on file  Social History Narrative  . Not on file   Social Determinants of Health   Financial Resource Strain:   . Difficulty of Paying Living Expenses:   Food Insecurity:   . Worried About Charity fundraiser in the Last Year:   . Arboriculturist in the Last Year:   Transportation Needs:   . Film/video editor (Medical):   Marland Kitchen Lack of Transportation (Non-Medical):   Physical Activity:   . Days of Exercise  per Week:   . Minutes of Exercise per Session:   Stress:   . Feeling of Stress :   Social Connections:   . Frequency of Communication with Friends and Family:   . Frequency of Social Gatherings with Friends and Family:   . Attends Religious Services:   . Active Member of Clubs or Organizations:   . Attends Archivist Meetings:   Marland Kitchen Marital Status:   Intimate Partner Violence:   . Fear of Current or Ex-Partner:   . Emotionally Abused:   Marland Kitchen Physically Abused:   . Sexually Abused:     Outpatient Medications Prior to Visit  Medication Sig Dispense Refill  . Multiple Vitamin (MULTIVITAMIN) tablet Take 1 tablet by mouth daily.    . rizatriptan (MAXALT) 10 MG tablet Take 1 tablet (10 mg total) by mouth as needed for migraine. May repeat in 2 hours if needed 10 tablet 0   No facility-administered medications prior to visit.    No Known Allergies  ROS Review of Systems Review of Systems  Constitutional: Negative for activity change, appetite change, chills and fever.  HENT: Negative for congestion, nosebleeds, trouble swallowing and voice change.   Respiratory: Negative for cough, shortness of breath and wheezing.  Gastrointestinal: Negative for diarrhea, nausea and vomiting.  Genitourinary: Negative for difficulty urinating, dysuria, flank pain and hematuria.  Musculoskeletal: Negative for back pain, joint swelling and neck pain.  Neurological: Negative for dizziness, speech difficulty, light-headedness and numbness.  See HPI. All other review of systems negative.     Objective:    Physical Exam  BP 104/70   Pulse 82   Temp 98.1 F (36.7 C) (Temporal)   Resp 15   Ht 5\' 4"  (1.626 m)   Wt 176 lb 6.4 oz (80 kg)   LMP 11/02/2018   SpO2 96%   BMI 30.28 kg/m  Wt Readings from Last 3 Encounters:  07/19/19 176 lb 6.4 oz (80 kg)  07/07/19 179 lb (81.2 kg)  02/18/19 177 lb 11.2 oz (80.6 kg)   Physical Exam  Constitutional: Oriented to person, place, and time.  Appears well-developed and well-nourished. Wearing hijab HENT:  Head: Normocephalic and atraumatic.  Eyes: Conjunctivae and EOM are normal.  Neck: supple, no thyromegaly Cardiovascular: Normal rate, regular rhythm, normal heart sounds and intact distal pulses.  No murmur heard. Pulmonary/Chest: Effort normal and breath sounds normal. No stridor. No respiratory distress. Has no wheezes.  Neurological: Is alert and oriented to person, place, and time.  Skin: Skin is warm. Capillary refill takes less than 2 seconds.  Psychiatric: Has a normal mood and affect. Behavior is normal. Judgment and thought content normal.    Health Maintenance Due  Topic Date Due  . HIV Screening  Never done  . TETANUS/TDAP  Never done    There are no preventive care reminders to display for this patient.  Lab Results  Component Value Date   TSH 1.530 07/07/2019   Lab Results  Component Value Date   WBC 5.6 07/07/2019   HGB 12.8 07/07/2019   HCT 38.8 07/07/2019   MCV 83 07/07/2019   PLT 334 07/07/2019   Lab Results  Component Value Date   NA 136 03/25/2017   K 3.8 03/25/2017   CO2 20 (L) 03/25/2017   GLUCOSE 112 (H) 03/25/2017   BUN 13 03/25/2017   CREATININE 0.49 03/25/2017   BILITOT 0.9 03/25/2017   ALKPHOS 56 03/25/2017   AST 21 03/25/2017   ALT 20 03/25/2017   PROT 7.2 03/25/2017   ALBUMIN 3.7 03/25/2017   CALCIUM 9.2 03/25/2017   ANIONGAP 9 03/25/2017      Assessment & Plan:   Problem List Items Addressed This Visit      Other   Amenorrhea - Primary -  Continue monitor for the next six months - no need for hormonal changes at this time    New daily persistent headache- improved, continue PRN maxalt       No orders of the defined types were placed in this encounter.   Follow-up: No follow-ups on file.    05/23/2017, MD

## 2019-07-19 NOTE — Addendum Note (Signed)
Addended by: Collie Siad A on: 07/19/2019 10:35 AM   Modules accepted: Orders

## 2019-08-11 ENCOUNTER — Ambulatory Visit: Payer: BC Managed Care – PPO | Attending: Internal Medicine

## 2019-08-11 ENCOUNTER — Encounter: Payer: BC Managed Care – PPO | Admitting: Adult Health Nurse Practitioner

## 2019-08-11 DIAGNOSIS — Z20822 Contact with and (suspected) exposure to covid-19: Secondary | ICD-10-CM

## 2019-08-12 LAB — NOVEL CORONAVIRUS, NAA: SARS-CoV-2, NAA: NOT DETECTED

## 2019-08-12 LAB — SARS-COV-2, NAA 2 DAY TAT

## 2019-09-23 ENCOUNTER — Telehealth: Payer: Self-pay | Admitting: Family Medicine

## 2019-09-23 NOTE — Telephone Encounter (Signed)
Referral followup 

## 2019-11-15 ENCOUNTER — Other Ambulatory Visit: Payer: Self-pay

## 2019-11-15 ENCOUNTER — Encounter: Payer: Self-pay | Admitting: Registered Nurse

## 2019-11-15 ENCOUNTER — Ambulatory Visit (INDEPENDENT_AMBULATORY_CARE_PROVIDER_SITE_OTHER): Payer: BC Managed Care – PPO | Admitting: Registered Nurse

## 2019-11-15 VITALS — BP 114/72 | HR 97 | Temp 98.0°F | Ht 64.0 in | Wt 169.0 lb

## 2019-11-15 DIAGNOSIS — N926 Irregular menstruation, unspecified: Secondary | ICD-10-CM

## 2019-11-15 LAB — POCT URINE PREGNANCY: Preg Test, Ur: POSITIVE — AB

## 2019-11-15 NOTE — Progress Notes (Signed)
Established Patient Office Visit  Subjective:  Patient ID: Meagan Lee, female    DOB: December 12, 1985  Age: 34 y.o. MRN: [id number]  CC: No chief complaint on file.   HPI Meagan Lee presents for missed menses  lmp around 3 mo ago Seeking pregnancy Home test positive No pain, cramping, bleeding, or other concerns at this time  No past medical history on file.  Past Surgical History:  Procedure Laterality Date  . CESAREAN SECTION      Family History  Problem Relation Age of Onset  . Hypertension Maternal Grandmother   . Asthma Maternal Grandfather   . Hypertension Paternal Aunt   . Hypertension Paternal Uncle     Social History   Socioeconomic History  . Marital status: Married    Spouse name: Not on file  . Number of children: Not on file  . Years of education: Not on file  . Highest education level: Not on file  Occupational History  . Not on file  Tobacco Use  . Smoking status: Never Smoker  . Smokeless tobacco: Never Used  Vaping Use  . Vaping Use: Never used  Substance and Sexual Activity  . Alcohol use: No  . Drug use: No  . Sexual activity: Yes  Other Topics Concern  . Not on file  Social History Narrative  . Not on file   Social Determinants of Health   Financial Resource Strain:   . Difficulty of Paying Living Expenses: Not on file  Food Insecurity:   . Worried About Programme researcher, broadcasting/film/video in the Last Year: Not on file  . Ran Out of Food in the Last Year: Not on file  Transportation Needs:   . Lack of Transportation (Medical): Not on file  . Lack of Transportation (Non-Medical): Not on file  Physical Activity:   . Days of Exercise per Week: Not on file  . Minutes of Exercise per Session: Not on file  Stress:   . Feeling of Stress : Not on file  Social Connections:   . Frequency of Communication with Friends and Family: Not on file  . Frequency of Social Gatherings with Friends and Family: Not on file  . Attends Religious Services: Not on  file  . Active Member of Clubs or Organizations: Not on file  . Attends Banker Meetings: Not on file  . Marital Status: Not on file  Intimate Partner Violence:   . Fear of Current or Ex-Partner: Not on file  . Emotionally Abused: Not on file  . Physically Abused: Not on file  . Sexually Abused: Not on file    Outpatient Medications Prior to Visit  Medication Sig Dispense Refill  . Multiple Vitamin (MULTIVITAMIN) tablet Take 1 tablet by mouth daily.    . ergocalciferol (DRISDOL) 200 MCG/ML drops Take 6.3 mLs (50,000 Units total) by mouth once a week. (Patient not taking: Reported on 11/15/2019) 60 mL 3  . rizatriptan (MAXALT) 10 MG tablet Take 1 tablet (10 mg total) by mouth as needed for migraine. May repeat in 2 hours if needed (Patient not taking: Reported on 11/15/2019) 10 tablet 0   No facility-administered medications prior to visit.    No Known Allergies  ROS Review of Systems Pertinent negatives and positives per hpi     Objective:    Physical Exam Vitals and nursing note reviewed.  Constitutional:      General: She is not in acute distress.    Appearance: Normal appearance. She is normal weight.  She is not ill-appearing, toxic-appearing or diaphoretic.  Cardiovascular:     Rate and Rhythm: Normal rate and regular rhythm.  Pulmonary:     Effort: Pulmonary effort is normal. No respiratory distress.  Skin:    General: Skin is warm and dry.  Neurological:     General: No focal deficit present.     Mental Status: She is alert and oriented to person, place, and time. Mental status is at baseline.  Psychiatric:        Mood and Affect: Mood normal.        Behavior: Behavior normal.        Thought Content: Thought content normal.        Judgment: Judgment normal.     BP 114/72   Pulse 97   Temp 98 F (36.7 C) (Temporal)   Ht 5\' 4"  (1.626 m)   Wt 169 lb (76.7 kg)   LMP 05/19/2019   SpO2 98%   BMI 29.01 kg/m  Wt Readings from Last 3 Encounters:   11/15/19 169 lb (76.7 kg)  07/19/19 176 lb 6.4 oz (80 kg)  07/07/19 179 lb (81.2 kg)     Health Maintenance Due  Topic Date Due  . Hepatitis C Screening  Never done  . HIV Screening  Never done  . TETANUS/TDAP  Never done  . INFLUENZA VACCINE  10/17/2019    There are no preventive care reminders to display for this patient.  Lab Results  Component Value Date   TSH 1.530 07/07/2019   Lab Results  Component Value Date   WBC 5.6 07/07/2019   HGB 12.8 07/07/2019   HCT 38.8 07/07/2019   MCV 83 07/07/2019   PLT 334 07/07/2019   Lab Results  Component Value Date   NA 136 03/25/2017   K 3.8 03/25/2017   CO2 20 (L) 03/25/2017   GLUCOSE 112 (H) 03/25/2017   BUN 13 03/25/2017   CREATININE 0.49 03/25/2017   BILITOT 0.9 03/25/2017   ALKPHOS 56 03/25/2017   AST 21 03/25/2017   ALT 20 03/25/2017   PROT 7.2 03/25/2017   ALBUMIN 3.7 03/25/2017   CALCIUM 9.2 03/25/2017   ANIONGAP 9 03/25/2017   No results found for: CHOL No results found for: HDL No results found for: LDLCALC No results found for: TRIG No results found for: CHOLHDL No results found for: 05/23/2017    Assessment & Plan:   Problem List Items Addressed This Visit    None    Visit Diagnoses    Missed menses    -  Primary   Relevant Orders   POCT urine pregnancy (Completed)   Ambulatory referral to Obstetrics / Gynecology      No orders of the defined types were placed in this encounter.   Follow-up: No follow-ups on file.   PLAN  Pregnancy test positive  Pt requests referral to obgyn  Suggest healthy diet, mild to moderate exercise, and prenatal vitamins  Reviewed ER precautions with patient  Patient encouraged to call clinic with any questions, comments, or concerns.  [id number], NP

## 2019-11-15 NOTE — Patient Instructions (Signed)
° ° ° °  If you have lab work done today you will be contacted with your lab results within the next 2 weeks.  If you have not heard from us then please contact us. The fastest way to get your results is to register for My Chart. ° ° °IF you received an x-ray today, you will receive an invoice from [facility]. Please contact [facility] at [phone] with questions or concerns regarding your invoice.  ° °IF you received labwork today, you will receive an invoice from [facility]. Please contact [facility] at [phone] with questions or concerns regarding your invoice.  ° °Our billing staff will not be able to assist you with questions regarding bills from these companies. ° °You will be contacted with the lab results as soon as they are available. The fastest way to get your results is to activate your My Chart account. Instructions are located on the last page of this paperwork. If you have not heard from us regarding the results in 2 weeks, please contact this office. °  ° ° ° °

## 2019-12-08 ENCOUNTER — Other Ambulatory Visit: Payer: Self-pay

## 2019-12-08 ENCOUNTER — Encounter: Payer: Self-pay | Admitting: Advanced Practice Midwife

## 2019-12-08 ENCOUNTER — Other Ambulatory Visit (HOSPITAL_COMMUNITY)
Admission: RE | Admit: 2019-12-08 | Discharge: 2019-12-08 | Disposition: A | Discharge status: Home / Self Care | Payer: BC Managed Care – PPO | Source: Ambulatory Visit | Attending: Advanced Practice Midwife | Admitting: Advanced Practice Midwife

## 2019-12-08 ENCOUNTER — Ambulatory Visit (INDEPENDENT_AMBULATORY_CARE_PROVIDER_SITE_OTHER): Payer: BC Managed Care – PPO | Admitting: Advanced Practice Midwife

## 2019-12-08 VITALS — BP 129/70 | HR 96 | Wt 173.0 lb

## 2019-12-08 DIAGNOSIS — O09299 Supervision of pregnancy with other poor reproductive or obstetric history, unspecified trimester: Secondary | ICD-10-CM | POA: Diagnosis not present

## 2019-12-08 DIAGNOSIS — Z3A29 29 weeks gestation of pregnancy: Secondary | ICD-10-CM

## 2019-12-08 DIAGNOSIS — O0990 Supervision of high risk pregnancy, unspecified, unspecified trimester: Secondary | ICD-10-CM | POA: Diagnosis present

## 2019-12-08 DIAGNOSIS — Z23 Encounter for immunization: Secondary | ICD-10-CM | POA: Diagnosis not present

## 2019-12-08 DIAGNOSIS — Z98891 History of uterine scar from previous surgery: Secondary | ICD-10-CM | POA: Diagnosis not present

## 2019-12-08 DIAGNOSIS — O1493 Unspecified pre-eclampsia, third trimester: Secondary | ICD-10-CM | POA: Diagnosis not present

## 2019-12-08 HISTORY — DX: Supervision of high risk pregnancy, unspecified, unspecified trimester: O09.90

## 2019-12-08 MED ORDER — BUTALBITAL-APAP-CAFFEINE 50-325-40 MG PO TABS: 1.0000 | ORAL_TABLET | Freq: Four times a day (QID) | ORAL | 0 refills | Status: DC | PRN

## 2019-12-08 MED ORDER — PRENATAL VITAMIN 27-0.8 MG PO TABS: 1.0000 | ORAL_TABLET | Freq: Every day | ORAL | 11 refills | Status: DC

## 2019-12-08 MED ORDER — FAMOTIDINE 40 MG PO TABS: 40.0000 mg | ORAL_TABLET | Freq: Every day | ORAL | 11 refills | Status: DC

## 2019-12-08 NOTE — Progress Notes (Signed)
Subjective:   Meagan Lee is a 34 y.o. [id number] at [redacted]w[redacted]d by LMP being seen today for her first obstetrical visit.  Her obstetrical history is significant for pre-eclampsia and prior c-section x 2 . Patient does intend to breast feed. Pregnancy history fully reviewed.   Patient reports that she pre-eclampsia with both her prior pregnancies.   Patient reports no complaints.  HISTORY: OB History  Gravida Para Term Preterm AB Living  4 2 2  0 1 2  SAB TAB Ectopic Multiple Live Births  1 0 0 0 2    # Outcome Date GA Lbr Len/2nd Weight Sex Delivery Anes PTL Lv  4 Current           3 SAB 2020 [redacted]w[redacted]d         2 Term 07/04/13 [redacted]w[redacted]d   M CS-LTranv Spinal  LIV     Complications: Failure to Progress in Second Stage, Pre-eclampsia  1 Term 05/17/10 [redacted]w[redacted]d   M CS-LTranv Spinal  LIV     Complications: Pre-eclampsia, Oligohydramnios    Last pap smear was done 2020 and was normal  History reviewed. No pertinent past medical history. Past Surgical History:  Procedure Laterality Date  . CESAREAN SECTION     Family History  Problem Relation Age of Onset  . Hypertension Maternal Grandmother   . Asthma Maternal Grandfather   . Hypertension Paternal Aunt   . Hypertension Paternal Uncle    Social History   Tobacco Use  . Smoking status: Never Smoker  . Smokeless tobacco: Never Used  Vaping Use  . Vaping Use: Never used  Substance Use Topics  . Alcohol use: No  . Drug use: No   No Known Allergies Current Outpatient Medications on File Prior to Visit  Medication Sig Dispense Refill  . Multiple Vitamin (MULTIVITAMIN) tablet Take 1 tablet by mouth daily.     No current facility-administered medications on file prior to visit.    Review of Systems Pertinent items noted in HPI and remainder of comprehensive ROS otherwise negative.  Exam   Vitals:   12/08/19 1510  BP: 129/70  Pulse: 96  Weight: 173 lb (78.5 kg)   Fetal Heart Rate (bpm): 142 Fundal Height 28 cm   Physical  Exam Vitals and nursing note reviewed.  Constitutional:      General: She is not in acute distress. HENT:     Head: Normocephalic.  Abdominal:     Tenderness: There is no abdominal tenderness.  Skin:    General: Skin is warm and dry.  Neurological:     Mental Status: She is alert and oriented to person, place, and time.  Psychiatric:        Mood and Affect: Mood normal.        Behavior: Behavior normal.      Assessment:   Pregnancy: 12/10/19 Patient Active Problem List   Diagnosis Date Noted  . Supervision of high risk pregnancy, antepartum 12/08/2019  . Infertility, female 07/07/2019  . Other fatigue 07/07/2019  . Vitamin D deficiency 07/07/2019  . New daily persistent headache 07/07/2019  . Missed ab 01/18/2019  . Prior c-section x 2  01/11/2019  . History of pre-eclampsia in prior pregnancy, currently pregnant 01/11/2019  . Amenorrhea 08/13/2018     Plan:  1. Supervision of high risk pregnancy, antepartum - 08/15/2018 MFM OB COMP + 14 WK; Future  2. [redacted] weeks gestation of pregnancy - Patient has had covid vaccine - Declined flu vaccine   - 28 week labs  and GTT at next visit   3. Hx of pre-eclampsia with prior pregnancies  - Baseline labs today  - CBC, CMET P:Cr   4. Stomach pain - Patient reports pain in her stomach that she does not think is heartburn. She is worried she has an ulcer. Pain feels sharp like something tearing on the left side. Comes and goes. Will try pepcid daily.    Initial labs drawn. Continue prenatal vitamins. Genetic Screening discussed, NIPS: requested. Ultrasound discussed; fetal anatomic survey: requested. Problem list reviewed and updated. The nature of [name] with multiple MDs and other Advanced Practice Providers was explained to patient; also emphasized that residents, students are part of our team. Routine obstetric precautions reviewed. 50% of 45 min visit spent in counseling and coordination  of care. Return in about 2 weeks (around 12/22/2019).   Thressa Sheller DNP, CNM  12/08/19  3:50 PM

## 2019-12-08 NOTE — Progress Notes (Signed)
1st baby she had preeclampsia  2nd baby she stated didn't descend after she was fully dilated Wants 3rd scheduled cesarean  01/11/2019 last pap

## 2019-12-09 LAB — CBC/D/PLT+RPR+RH+ABO+RUB AB...
Antibody Screen: NEGATIVE
Basophils Absolute: 0 10*3/uL (ref 0.0–0.2)
Basos: 0 %
EOS (ABSOLUTE): 0.2 10*3/uL (ref 0.0–0.4)
Eos: 2 %
HCV Ab: <0.1 s/co ratio (ref 0.0–0.9)
HIV Screen 4th Generation wRfx: NONREACTIVE
Hematocrit: 35 % (ref 34.0–46.6)
Hemoglobin: 11.8 g/dL (ref 11.1–15.9)
Hepatitis B Surface Ag: NEGATIVE
Immature Grans (Abs): 0.1 10*3/uL (ref 0.0–0.1)
Immature Granulocytes: 1 %
Lymphocytes Absolute: 2.9 10*3/uL (ref 0.7–3.1)
Lymphs: 29 %
MCH: 28.3 pg (ref 26.6–33.0)
MCHC: 33.7 g/dL (ref 31.5–35.7)
MCV: 84 fL (ref 79–97)
Monocytes Absolute: 1 10*3/uL — ABNORMAL HIGH (ref 0.1–0.9)
Monocytes: 10 %
Neutrophils Absolute: 5.9 10*3/uL (ref 1.4–7.0)
Neutrophils: 58 %
Platelets: 302 10*3/uL (ref 150–450)
RBC: 4.17 x10E6/uL (ref 3.77–5.28)
RDW: 12.1 % (ref 11.7–15.4)
RPR Ser Ql: NONREACTIVE
Rh Factor: POSITIVE
Rubella Antibodies, IGG: 18.5 index (ref >0.99)
WBC: 10.1 10*3/uL (ref 3.4–10.8)

## 2019-12-09 LAB — COMPREHENSIVE METABOLIC PANEL
ALT: 11 IU/L (ref 0–32)
AST: 16 IU/L (ref 0–40)
Albumin/Globulin Ratio: 1.2 (ref 1.2–2.2)
Albumin: 3.7 g/dL — ABNORMAL LOW (ref 3.8–4.8)
Alkaline Phosphatase: 77 IU/L (ref 44–121)
BUN/Creatinine Ratio: 14 (ref 9–23)
BUN: 6 mg/dL (ref 6–20)
Bilirubin Total: <0.2 mg/dL (ref 0.0–1.2)
CO2: 17 mmol/L — ABNORMAL LOW (ref 20–29)
Calcium: 9 mg/dL (ref 8.7–10.2)
Chloride: 105 mmol/L (ref 96–106)
Creatinine, Ser: 0.44 mg/dL — ABNORMAL LOW (ref 0.57–1.00)
GFR calc Af Amer: 152 mL/min/{1.73_m2} (ref >59)
GFR calc non Af Amer: 132 mL/min/{1.73_m2} (ref >59)
Globulin, Total: 3.1 g/dL (ref 1.5–4.5)
Glucose: 100 mg/dL — ABNORMAL HIGH (ref 65–99)
Potassium: 4.3 mmol/L (ref 3.5–5.2)
Sodium: 137 mmol/L (ref 134–144)
Total Protein: 6.8 g/dL (ref 6.0–8.5)

## 2019-12-09 LAB — PROTEIN / CREATININE RATIO, URINE
Creatinine, Urine: 170.8 mg/dL
Protein, Ur: 14.3 mg/dL
Protein/Creat Ratio: 84 mg/g creat (ref 0–200)

## 2019-12-09 LAB — HCV INTERPRETATION

## 2019-12-10 LAB — CERVICOVAGINAL ANCILLARY ONLY
Chlamydia: NEGATIVE
Comment: NEGATIVE
Comment: NORMAL
Neisseria Gonorrhea: NEGATIVE

## 2019-12-10 LAB — CULTURE, OB URINE

## 2019-12-10 LAB — URINE CULTURE, OB REFLEX

## 2019-12-22 ENCOUNTER — Encounter: Payer: BC Managed Care – PPO | Admitting: Obstetrics and Gynecology

## 2019-12-23 ENCOUNTER — Ambulatory Visit: Payer: BC Managed Care – PPO | Attending: Advanced Practice Midwife

## 2019-12-23 ENCOUNTER — Other Ambulatory Visit: Payer: Self-pay

## 2019-12-23 ENCOUNTER — Other Ambulatory Visit: Payer: Self-pay | Admitting: *Deleted

## 2019-12-23 ENCOUNTER — Other Ambulatory Visit: Payer: BC Managed Care – PPO

## 2019-12-23 ENCOUNTER — Ambulatory Visit: Payer: BC Managed Care – PPO | Admitting: *Deleted

## 2019-12-23 ENCOUNTER — Other Ambulatory Visit: Payer: Self-pay | Admitting: General Practice

## 2019-12-23 ENCOUNTER — Ambulatory Visit (INDEPENDENT_AMBULATORY_CARE_PROVIDER_SITE_OTHER): Payer: BC Managed Care – PPO

## 2019-12-23 VITALS — BP 114/76 | HR 101 | Wt 175.6 lb

## 2019-12-23 DIAGNOSIS — O0990 Supervision of high risk pregnancy, unspecified, unspecified trimester: Secondary | ICD-10-CM

## 2019-12-23 DIAGNOSIS — Z3A29 29 weeks gestation of pregnancy: Secondary | ICD-10-CM | POA: Diagnosis present

## 2019-12-23 DIAGNOSIS — G4452 New daily persistent headache (NDPH): Secondary | ICD-10-CM

## 2019-12-23 DIAGNOSIS — Z98891 History of uterine scar from previous surgery: Secondary | ICD-10-CM

## 2019-12-23 DIAGNOSIS — Z23 Encounter for immunization: Secondary | ICD-10-CM

## 2019-12-23 DIAGNOSIS — Z789 Other specified health status: Secondary | ICD-10-CM

## 2019-12-23 DIAGNOSIS — Z3A31 31 weeks gestation of pregnancy: Secondary | ICD-10-CM

## 2019-12-23 DIAGNOSIS — Z362 Encounter for other antenatal screening follow-up: Secondary | ICD-10-CM

## 2019-12-23 MED ORDER — MAGNESIUM 400 MG PO TABS: 1.0000 | ORAL_TABLET | Freq: Every day | ORAL | 3 refills | Status: DC

## 2019-12-23 NOTE — Patient Instructions (Signed)
Migraine Headache A migraine headache is an intense, throbbing pain on one side or both sides of the head. Migraine headaches may also cause other symptoms, such as nausea, vomiting, and sensitivity to light and noise. A migraine headache can last from 4 hours to 3 days. Talk with your doctor about what things may bring on (trigger) your migraine headaches. What are the causes? The exact cause of this condition is not known. However, a migraine may be caused when nerves in the brain become irritated and release chemicals that cause inflammation of blood vessels. This inflammation causes pain. This condition may be triggered or caused by:  Drinking alcohol.  Smoking.  Taking medicines, such as: ? Medicine used to treat chest pain (nitroglycerin). ? Birth control pills. ? Estrogen. ? Certain blood pressure medicines.  Eating or drinking products that contain nitrates, glutamate, aspartame, or tyramine. Aged cheeses, chocolate, or caffeine may also be triggers.  Doing physical activity. Other things that may trigger a migraine headache include:  Menstruation.  Pregnancy.  Hunger.  Stress.  Lack of sleep or too much sleep.  Weather changes.  Fatigue. What increases the risk? The following factors may make you more likely to experience migraine headaches:  Being a certain age. This condition is more common in people who are 25-55 years old.  Being female.  Having a family history of migraine headaches.  Being Caucasian.  Having a mental health condition, such as depression or anxiety.  Being obese. What are the signs or symptoms? The main symptom of this condition is pulsating or throbbing pain. This pain may:  Happen in any area of the head, such as on one side or both sides.  Interfere with daily activities.  Get worse with physical activity.  Get worse with exposure to bright lights or loud noises. Other symptoms may  include:  Nausea.  Vomiting.  Dizziness.  General sensitivity to bright lights, loud noises, or smells. Before you get a migraine headache, you may get warning signs (an aura). An aura may include:  Seeing flashing lights or having blind spots.  Seeing bright spots, halos, or zigzag lines.  Having tunnel vision or blurred vision.  Having numbness or a tingling feeling.  Having trouble talking.  Having muscle weakness. Some people have symptoms after a migraine headache (postdromal phase), such as:  Feeling tired.  Difficulty concentrating. How is this diagnosed? A migraine headache can be diagnosed based on:  Your symptoms.  A physical exam.  Tests, such as: ? CT scan or an MRI of the head. These imaging tests can help rule out other causes of headaches. ? Taking fluid from the spine (lumbar puncture) and analyzing it (cerebrospinal fluid analysis, or CSF analysis). How is this treated? This condition may be treated with medicines that:  Relieve pain.  Relieve nausea.  Prevent migraine headaches. Treatment for this condition may also include:  Acupuncture.  Lifestyle changes like avoiding foods that trigger migraine headaches.  Biofeedback.  Cognitive behavioral therapy. Follow these instructions at home: Medicines  Take over-the-counter and prescription medicines only as told by your health care provider.  Ask your health care provider if the medicine prescribed to you: ? Requires you to avoid driving or using heavy machinery. ? Can cause constipation. You may need to take these actions to prevent or treat constipation:  Drink enough fluid to keep your urine pale yellow.  Take over-the-counter or prescription medicines.  Eat foods that are high in fiber, such as beans, whole grains, and   fresh fruits and vegetables.  Limit foods that are high in fat and processed sugars, such as fried or sweet foods. Lifestyle  Do not drink alcohol.  Do not  use any products that contain nicotine or tobacco, such as cigarettes, e-cigarettes, and chewing tobacco. If you need help quitting, ask your health care provider.  Get at least 8 hours of sleep every night.  Find ways to manage stress, such as meditation, deep breathing, or yoga. General instructions      Keep a journal to find out what may trigger your migraine headaches. For example, write down: ? What you eat and drink. ? How much sleep you get. ? Any change to your diet or medicines.  If you have a migraine headache: ? Avoid things that make your symptoms worse, such as bright lights. ? It may help to lie down in a dark, quiet room. ? Do not drive or use heavy machinery. ? Ask your health care provider what activities are safe for you while you are experiencing symptoms.  Keep all follow-up visits as told by your health care provider. This is important. Contact a health care provider if:  You develop symptoms that are different or more severe than your usual migraine headache symptoms.  You have more than 15 headache days in one month. Get help right away if:  Your migraine headache becomes severe.  Your migraine headache lasts longer than 72 hours.  You have a fever.  You have a stiff neck.  You have vision loss.  Your muscles feel weak or like you cannot control them.  You start to lose your balance often.  You have trouble walking.  You faint.  You have a seizure. Summary  A migraine headache is an intense, throbbing pain on one side or both sides of the head. Migraines may also cause other symptoms, such as nausea, vomiting, and sensitivity to light and noise.  This condition may be treated with medicines and lifestyle changes. You may also need to avoid certain things that trigger a migraine headache.  Keep a journal to find out what may trigger your migraine headaches.  Contact your health care provider if you have more than 15 headache days in a  month or you develop symptoms that are different or more severe than your usual migraine headache symptoms. This information is not intended to replace advice given to you by your health care provider. Make sure you discuss any questions you have with your health care provider. Document Revised: 06/26/2018 Document Reviewed: 04/16/2018 Elsevier Patient Education  2020 [employer] Glucose Tolerance Test During Pregnancy Why am I having this test? The glucose tolerance test (GTT) is done to check how your body processes sugar (glucose). This is one of several tests used to diagnose diabetes that develops during pregnancy (gestational diabetes mellitus). Gestational diabetes is a temporary form of diabetes that some women develop during pregnancy. It usually occurs during the second trimester of pregnancy and goes away after delivery. Testing (screening) for gestational diabetes usually occurs between 24 and 28 weeks of pregnancy. You may have the GTT test after having a 1-hour glucose screening test if the results from that test indicate that you may have gestational diabetes. You may also have this test if:  You have a history of gestational diabetes.  You have a history of giving birth to very large babies or have experienced repeated fetal loss (stillbirth).  You have signs and symptoms of diabetes, such as: ? Changes in  your vision. ? Tingling or numbness in your hands or feet. ? Changes in hunger, thirst, and urination that are not otherwise explained by your pregnancy. What is being tested? This test measures the amount of glucose in your blood at different times during a period of 3 hours. This indicates how well your body is able to process glucose. What kind of sample is taken?  Blood samples are required for this test. They are usually collected by inserting a needle into a blood vessel. How do I prepare for this test?  For 3 days before your test, eat normally. Have plenty of  carbohydrate-rich foods.  Follow instructions from your health care provider about: ? Eating or drinking restrictions on the day of the test. You may be asked to not eat or drink anything other than water (fast) starting 8-10 hours before the test. ? Changing or stopping your regular medicines. Some medicines may interfere with this test. Tell a health care provider about:  All medicines you are taking, including vitamins, herbs, eye drops, creams, and over-the-counter medicines.  Any blood disorders you have.  Any surgeries you have had.  Any medical conditions you have. What happens during the test? First, your blood glucose will be measured. This is referred to as your fasting blood glucose, since you fasted before the test. Then, you will drink a glucose solution that contains a certain amount of glucose. Your blood glucose will be measured again 1, 2, and 3 hours after drinking the solution. This test takes about 3 hours to complete. You will need to stay at the testing location during this time. During the testing period:  Do not eat or drink anything other than the glucose solution.  Do not exercise.  Do not use any products that contain nicotine or tobacco, such as cigarettes and e-cigarettes. If you need help stopping, ask your health care provider. The testing procedure may vary among health care providers and hospitals. How are the results reported? Your results will be reported as milligrams of glucose per deciliter of blood (mg/dL) or millimoles per liter (mmol/L). Your health care provider will compare your results to normal ranges that were established after testing a large group of people (reference ranges). Reference ranges may vary among labs and hospitals. For this test, common reference ranges are:  Fasting: less than 95-105 mg/dL (8.1-8.2 mmol/L).  1 hour after drinking glucose: less than 180-190 mg/dL (99.3-71.6 mmol/L).  2 hours after drinking glucose: less than  155-165 mg/dL (9.6-7.8 mmol/L).  3 hours after drinking glucose: 140-145 mg/dL (9.3-8.1 mmol/L). What do the results mean? Results within reference ranges are considered normal, meaning that your glucose levels are well-controlled. If two or more of your blood glucose levels are high, you may be diagnosed with gestational diabetes. If only one level is high, your health care provider may suggest repeat testing or other tests to confirm a diagnosis. Talk with your health care provider about what your results mean. Questions to ask your health care provider Ask your health care provider, or the department that is doing the test:  When will my results be ready?  How will I get my results?  What are my treatment options?  What other tests do I need?  What are my next steps? Summary  The glucose tolerance test (GTT) is one of several tests used to diagnose diabetes that develops during pregnancy (gestational diabetes mellitus). Gestational diabetes is a temporary form of diabetes that some women develop during pregnancy.  You may have the GTT test after having a 1-hour glucose screening test if the results from that test indicate that you may have gestational diabetes. You may also have this test if you have any symptoms or risk factors for gestational diabetes.  Talk with your health care provider about what your results mean. This information is not intended to replace advice given to you by your health care provider. Make sure you discuss any questions you have with your health care provider. Document Revised: 06/25/2018 Document Reviewed: 10/14/2016 Elsevier Patient Education  2020 [employer].

## 2019-12-23 NOTE — Progress Notes (Signed)
HIGH-RISK PREGNANCY OFFICE VISIT  Patient name: Meagan Lee MRN [id number]  Date of birth: [birth year] Chief Complaint:   Routine Prenatal Visit  Subjective:   Meagan Lee is a 34 y.o. [id number] female at [redacted]w[redacted]d with an Estimated Date of Delivery: 02/23/20 being seen today for ongoing management of a high-risk pregnancy aeb has Prior c-section x 2 ; History of pre-eclampsia in prior pregnancy, currently pregnant; Infertility, female; Other fatigue; Vitamin D deficiency; New daily persistent headache; and Supervision of high risk pregnancy, antepartum on their problem list.  Patient presents today with complaint of headache. Patient states she is having headaches daily that lasts all day.  She reports taking tylenol and Fiorcet without relief of symptoms. She states yesterday she took one tylenol and two Fioricet and reports she usually takes only one.  However, yesterday was severe and she threw up twice. She reports drinking about 8 bottles of water daily and sometimes more.  She states she eats 3-4 meals daily.  Patient endorses fetal movement.  She denies abdominal cramping or contractions.  She also denies vaginal concerns including abnormal discharge, leaking of fluid, and bleeding.  Contractions: Not present. Vag. Bleeding: None.  Movement: Present.  Reviewed past medical,surgical, social, obstetrical and family history as well as problem list, medications and allergies.  Objective   Vitals:   12/23/19 0937  BP: 114/76  Pulse: (!) 101  Weight: 175 lb 9.6 oz (79.7 kg)  Body mass index is 30.14 kg/m.  Total Weight Gain:-5 lb 6.4 oz (-2.449 kg)         Physical Examination:   General appearance: Well appearing, and in no distress  Mental status: Alert, oriented to person, place, and time  Skin: Warm & dry  Cardiovascular: Normal heart rate noted  Respiratory: Normal respiratory effort, no distress  Abdomen: Soft, gravid, nontender, AGA with Fundal height of Fundal Height: 29  cm  Pelvic: Cervical exam deferred           Extremities: Edema: None  Fetal Status: Fetal Heart Rate (bpm): 145  Movement: Present   No results found for this or any previous visit (from the past 24 hour(s)).  Assessment & Plan:  Low-risk pregnancy of a 34 y.o., [id number] at [redacted]w[redacted]d with an Estimated Date of Delivery: 02/23/20   1. Supervision of high risk pregnancy, antepartum -Informed that genetic results are not present in Epic. -Instructed to contact [facility] directly as stated on info card. -Anticipatory guidance for upcoming appts.   2. History of cesarean delivery -Message sent for scheduling of C/S on or after December 1st.   3. [redacted] weeks gestation of pregnancy -Reviewed blood draw procedures and labs which also include check of iron level.  -Discussed how results of GTT are handled including diabetic education and BS testing for abnormal results and routine care for normal results.   4. New daily persistent headache -Will start Magnesium 400mg  supplement daily. -Informed that if headaches persist then we will send for neurology consult.   5. Language barrier to communication -Interpretations completed with assistance of in person interpreter: [facility]    Meds: No orders of the defined types were placed in this encounter.  Labs/procedures today:  Lab Orders  No laboratory test(s) ordered today     Reviewed: Preterm labor symptoms and general obstetric precautions including but not limited to vaginal bleeding, contractions, leaking of fluid and fetal movement were reviewed in detail with the patient.  All questions were answered.  Follow-up: Return in about 2 weeks (  around 01/06/2020) for HROB.  Orders Placed This Encounter  Procedures  . Tdap vaccine greater than or equal to 7yo IM   Cherre Robins MSN, CNM 12/23/2019

## 2019-12-23 NOTE — Progress Notes (Signed)
Headache for a week now , medcation not helping

## 2019-12-24 ENCOUNTER — Other Ambulatory Visit: Payer: Self-pay | Admitting: Obstetrics and Gynecology

## 2019-12-24 LAB — GLUCOSE TOLERANCE, 2 HOURS W/ 1HR
Glucose, 1 hour: 150 mg/dL (ref 65–179)
Glucose, 2 hour: 115 mg/dL (ref 65–152)
Glucose, Fasting: 80 mg/dL (ref 65–91)

## 2020-01-10 ENCOUNTER — Other Ambulatory Visit: Payer: Self-pay

## 2020-01-10 ENCOUNTER — Ambulatory Visit (INDEPENDENT_AMBULATORY_CARE_PROVIDER_SITE_OTHER): Payer: BC Managed Care – PPO

## 2020-01-10 VITALS — BP 129/64 | HR 102 | Wt 175.0 lb

## 2020-01-10 DIAGNOSIS — Z3A28 28 weeks gestation of pregnancy: Secondary | ICD-10-CM

## 2020-01-10 DIAGNOSIS — Z98891 History of uterine scar from previous surgery: Secondary | ICD-10-CM

## 2020-01-10 DIAGNOSIS — G4452 New daily persistent headache (NDPH): Secondary | ICD-10-CM

## 2020-01-10 DIAGNOSIS — O0990 Supervision of high risk pregnancy, unspecified, unspecified trimester: Secondary | ICD-10-CM

## 2020-01-10 MED ORDER — MAGNESIUM 400 MG PO TABS: 1.0000 | ORAL_TABLET | Freq: Every day | ORAL | 3 refills | Status: DC

## 2020-01-10 NOTE — Progress Notes (Signed)
HIGH-RISK PREGNANCY OFFICE VISIT  Patient name: Meagan Lee MRN [id number]  Date of birth: Jul 18, 1985 Chief Complaint:    Subjective:   Meagan Lee is a 34 y.o. [phone] female at [redacted]w[redacted]d with an Estimated Date of Delivery: 04/02/20 being seen today for ongoing management of a high-risk pregnancy aeb has Prior c-section x 2 ; History of pre-eclampsia in prior pregnancy, currently pregnant; Infertility, female; Other fatigue; Vitamin D deficiency; New daily persistent headache; and Supervision of high risk pregnancy, antepartum on their problem list.  Patient presents today without pregnancy complaint. Patient endorses fetal movement.  Patient denies vaginal concerns including abnormal discharge, leaking of fluid, and bleeding. Patient reports that she was unable to receive her magnesium script, but her HA have improved with tylenol.  Patient also reports that her EDD was changed, but feels this was a mistake as she always has small children.  Contractions: Not present. Vag. Bleeding: None.  Movement: Present.  Reviewed past medical,surgical, social, obstetrical and family history as well as problem list, medications and allergies.  Objective   Vitals:   01/10/20 1012  BP: 129/64  Pulse: (!) 102  Weight: 175 lb (79.4 kg)  Body mass index is 30.04 kg/m.  Total Weight Gain:-6 lb (-2.722 kg)         Physical Examination:   General appearance: Well appearing, and in no distress  Mental status: Alert, oriented to person, place, and time  Skin: Warm & dry  Cardiovascular: Normal heart rate noted  Respiratory: Normal respiratory effort, no distress  Abdomen: Soft, gravid, nontender, AGA with Fundal height of Fundal Height: 31 cm  Pelvic: Cervical exam deferred           Extremities: Edema: None  Fetal Status: Fetal Heart Rate (bpm): 158  Movement: Present   No results found for this or any previous visit (from the past 24 hour(s)).  Assessment & Plan:  High-risk pregnancy of a 34 y.o.,  [id number] at [redacted]w[redacted]d with an Estimated Date of Delivery: 04/02/20   1. Supervision of high risk pregnancy, antepartum -Reviewed weight loss of 6 lbs.   -Patient reports normal occurrence during pregnancy.  2. [redacted] weeks gestation of pregnancy -Reviewed change in EDD of 02/23/2020 to 04/03/2019. -Informed of need for repeat GTT d/t last one was technically too early with date change. -Patient verbalizes understanding. Requests that appt be scheduled after 11/5 [place]. -Will schedule for 11/8 and cancel if dates are changed.  3. New daily persistent headache -Patient states headaches have improved with tylenol dosing.  -Patient states she was unable to pick up Magnesium tablets and would like to have just in case. -Paper script given for Magnesium 400mg  tablets.  4. History of cesarean delivery -Patient scheduled for 02/16/2020. -Informed that C/S would be rescheduled based on new EDD.  -Patient requests that change in date be held until after next [place]. -Informed that this can be accommodated, but that dates would not likely change again.     Meds: No orders of the defined types were placed in this encounter.  Labs/procedures today:  Lab Orders  No laboratory test(s) ordered today     Reviewed: Preterm labor symptoms and general obstetric precautions including but not limited to vaginal bleeding, contractions, leaking of fluid and fetal movement were reviewed in detail with the patient.  All questions were answered.  Follow-up: Return in about 2 weeks (around 01/24/2020) for LROB with GTT.  No orders of the defined types were placed in this encounter.  Meagan Lee  [facility] MSN, CNM 01/10/2020

## 2020-01-10 NOTE — Progress Notes (Signed)
Language Resources [place].

## 2020-01-10 NOTE — Patient Instructions (Signed)
Glucose Tolerance Test During Pregnancy Why am I having this test? The glucose tolerance test (GTT) is done to check how your body processes sugar (glucose). This is one of several tests used to diagnose diabetes that develops during pregnancy (gestational diabetes mellitus). Gestational diabetes is a temporary form of diabetes that some women develop during pregnancy. It usually occurs during the second trimester of pregnancy and goes away after delivery. Testing (screening) for gestational diabetes usually occurs between 24 and 28 weeks of pregnancy. You may have the GTT test after having a 1-hour glucose screening test if the results from that test indicate that you may have gestational diabetes. You may also have this test if:  You have a history of gestational diabetes.  You have a history of giving birth to very large babies or have experienced repeated fetal loss (stillbirth).  You have signs and symptoms of diabetes, such as: ? Changes in your vision. ? Tingling or numbness in your hands or feet. ? Changes in hunger, thirst, and urination that are not otherwise explained by your pregnancy. What is being tested? This test measures the amount of glucose in your blood at different times during a period of 3 hours. This indicates how well your body is able to process glucose. What kind of sample is taken?  Blood samples are required for this test. They are usually collected by inserting a needle into a blood vessel. How do I prepare for this test?  For 3 days before your test, eat normally. Have plenty of carbohydrate-rich foods.  Follow instructions from your health care provider about: ? Eating or drinking restrictions on the day of the test. You may be asked to not eat or drink anything other than water (fast) starting 8-10 hours before the test. ? Changing or stopping your regular medicines. Some medicines may interfere with this test. Tell a health care provider about:  All  medicines you are taking, including vitamins, herbs, eye drops, creams, and over-the-counter medicines.  Any blood disorders you have.  Any surgeries you have had.  Any medical conditions you have. What happens during the test? First, your blood glucose will be measured. This is referred to as your fasting blood glucose, since you fasted before the test. Then, you will drink a glucose solution that contains a certain amount of glucose. Your blood glucose will be measured again 1, 2, and 3 hours after drinking the solution. This test takes about 3 hours to complete. You will need to stay at the testing location during this time. During the testing period:  Do not eat or drink anything other than the glucose solution.  Do not exercise.  Do not use any products that contain nicotine or tobacco, such as cigarettes and e-cigarettes. If you need help stopping, ask your health care provider. The testing procedure may vary among health care providers and hospitals. How are the results reported? Your results will be reported as milligrams of glucose per deciliter of blood (mg/dL) or millimoles per liter (mmol/L). Your health care provider will compare your results to normal ranges that were established after testing a large group of people (reference ranges). Reference ranges may vary among labs and hospitals. For this test, common reference ranges are:  Fasting: less than 95-105 mg/dL (5.3-5.8 mmol/L).  1 hour after drinking glucose: less than 180-190 mg/dL (10.0-10.5 mmol/L).  2 hours after drinking glucose: less than 155-165 mg/dL (8.6-9.2 mmol/L).  3 hours after drinking glucose: 140-145 mg/dL (7.8-8.1 mmol/L). What do the   results mean? Results within reference ranges are considered normal, meaning that your glucose levels are well-controlled. If two or more of your blood glucose levels are high, you may be diagnosed with gestational diabetes. If only one level is high, your health care  provider may suggest repeat testing or other tests to confirm a diagnosis. Talk with your health care provider about what your results mean. Questions to ask your health care provider Ask your health care provider, or the department that is doing the test:  When will my results be ready?  How will I get my results?  What are my treatment options?  What other tests do I need?  What are my next steps? Summary  The glucose tolerance test (GTT) is one of several tests used to diagnose diabetes that develops during pregnancy (gestational diabetes mellitus). Gestational diabetes is a temporary form of diabetes that some women develop during pregnancy.  You may have the GTT test after having a 1-hour glucose screening test if the results from that test indicate that you may have gestational diabetes. You may also have this test if you have any symptoms or risk factors for gestational diabetes.  Talk with your health care provider about what your results mean. This information is not intended to replace advice given to you by your health care provider. Make sure you discuss any questions you have with your health care provider. Document Revised: 06/25/2018 Document Reviewed: 10/14/2016 Elsevier Patient Education  2020 Elsevier Inc.  

## 2020-01-19 ENCOUNTER — Encounter: Payer: Self-pay | Admitting: *Deleted

## 2020-01-20 ENCOUNTER — Other Ambulatory Visit: Payer: Self-pay

## 2020-01-20 DIAGNOSIS — O0990 Supervision of high risk pregnancy, unspecified, unspecified trimester: Secondary | ICD-10-CM

## 2020-01-21 ENCOUNTER — Ambulatory Visit: Payer: BC Managed Care – PPO | Admitting: *Deleted

## 2020-01-21 ENCOUNTER — Ambulatory Visit: Payer: BC Managed Care – PPO | Attending: Obstetrics and Gynecology

## 2020-01-21 ENCOUNTER — Other Ambulatory Visit: Payer: Self-pay

## 2020-01-21 ENCOUNTER — Other Ambulatory Visit: Payer: Self-pay | Admitting: *Deleted

## 2020-01-21 DIAGNOSIS — Z363 Encounter for antenatal screening for malformations: Secondary | ICD-10-CM | POA: Diagnosis not present

## 2020-01-21 DIAGNOSIS — Z362 Encounter for other antenatal screening follow-up: Secondary | ICD-10-CM | POA: Insufficient documentation

## 2020-01-21 DIAGNOSIS — O0990 Supervision of high risk pregnancy, unspecified, unspecified trimester: Secondary | ICD-10-CM | POA: Insufficient documentation

## 2020-01-21 DIAGNOSIS — O34219 Maternal care for unspecified type scar from previous cesarean delivery: Secondary | ICD-10-CM | POA: Diagnosis not present

## 2020-01-21 DIAGNOSIS — Z3A29 29 weeks gestation of pregnancy: Secondary | ICD-10-CM

## 2020-01-21 DIAGNOSIS — E669 Obesity, unspecified: Secondary | ICD-10-CM

## 2020-01-21 DIAGNOSIS — O09293 Supervision of pregnancy with other poor reproductive or obstetric history, third trimester: Secondary | ICD-10-CM | POA: Diagnosis not present

## 2020-01-21 DIAGNOSIS — O99212 Obesity complicating pregnancy, second trimester: Secondary | ICD-10-CM | POA: Diagnosis not present

## 2020-01-24 ENCOUNTER — Encounter: Payer: Self-pay | Admitting: Obstetrics and Gynecology

## 2020-01-24 ENCOUNTER — Ambulatory Visit (INDEPENDENT_AMBULATORY_CARE_PROVIDER_SITE_OTHER): Payer: BC Managed Care – PPO | Admitting: Obstetrics and Gynecology

## 2020-01-24 ENCOUNTER — Other Ambulatory Visit: Payer: Self-pay

## 2020-01-24 ENCOUNTER — Other Ambulatory Visit: Payer: BC Managed Care – PPO

## 2020-01-24 VITALS — BP 127/77 | HR 78 | Wt 177.8 lb

## 2020-01-24 DIAGNOSIS — Z3A30 30 weeks gestation of pregnancy: Secondary | ICD-10-CM

## 2020-01-24 DIAGNOSIS — R55 Syncope and collapse: Secondary | ICD-10-CM

## 2020-01-24 DIAGNOSIS — O0990 Supervision of high risk pregnancy, unspecified, unspecified trimester: Secondary | ICD-10-CM

## 2020-01-24 NOTE — Progress Notes (Signed)
HIGH-RISK PREGNANCY OFFICE VISIT Patient name: Meagan Lee MRN [id number]  Date of birth: 1985-10-17 Chief Complaint:   Routine Prenatal Visit  History of Present Illness:   Meagan Lee is a 34 y.o. G35P2012 female at [redacted]w[redacted]d with an Estimated Date of Delivery: 04/02/20 being seen today for ongoing management of a high-risk pregnancy complicated by h/o PEC Today she reports no complaints.  She reports having a sharp, upper abdominal pain that resolved after vomiting 4 days ago; no issues today. She is having her 2 hr GTT today.  Contractions: Not present. Vag. Bleeding: None.  Movement: Present. denies leaking of fluid.  Review of Systems:   Pertinent items are noted in HPI Denies abnormal vaginal discharge w/ itching/odor/irritation, headaches, visual changes, shortness of breath, chest pain, abdominal pain, severe nausea/vomiting, or problems with urination or bowel movements unless otherwise stated above. Pertinent History Reviewed:  Reviewed past medical,surgical, social, obstetrical and family history.  Reviewed problem list, medications and allergies. Physical Assessment:   Vitals:   01/24/20 0858 01/24/20 0913 01/24/20 0946  BP: 112/81 (!) 154/92 127/77  Pulse: 91 83 78  Weight: 177 lb 12.8 oz (80.6 kg)    Body mass index is 30.52 kg/m.           Physical Examination:   General appearance: alert, well appearing, and in no distress and oriented to person, place, and time  Mental status: alert, oriented to person, place, and time, normal mood, behavior, speech, dress, motor activity, and thought processes  Skin: warm & dry   Extremities: Edema: None    Cardiovascular: normal heart rate noted  Respiratory: normal respiratory effort, no distress  Abdomen: gravid, soft, non-tender  Pelvic: Cervical exam deferred         Fetal Status: Fetal Heart Rate (bpm): 141 Fundal Height: 28 cm Movement: Present    Fetal Surveillance Testing today: none   [place] MFM OB FOLLOW UP (Accession  [id number]) (Order [id number]) Imaging Date: 01/21/2020 Department: [facility] Released By: Colin Rhein I Authorizing: Barton Dubois, MD   Narrative & Impression  ----------------------------------------------------------------------  OBSTETRICS REPORT                       (Signed Final 01/21/2020 12:47 pm) ---------------------------------------------------------------------- Patient Info  ID #:       [id number]                          D.O.B.:  Nov 22, 1985 (34 yrs)  Name:       Meagan Lee                    Visit Date: 01/21/2020 11:39 am ---------------------------------------------------------------------- Performed By  Attending:        Noralee Space MD        Ref. Address:     [address] [place],  [place] [address]  Performed By:     Reinaldo Raddle            [facility]  Referred By:      Armando Reichert CNM ---------------------------------------------------------------------- Orders  #  Description                           Code        Ordered By  1  [place] MFM OB FOLLOW UP                   [phone]    YU FANG ----------------------------------------------------------------------  #  Order #                     Accession #                Episode #  1  [id number]                   [id number]                 [id number] ---------------------------------------------------------------------- Indications  [redacted] weeks gestation of pregnancy                Z3A.29  Obesity complicating pregnancy, second         O99.212  trimester (BMI 31)  Poor obstetric history: Previous                O09.299  preeclampsia / eclampsia/gestational HTN  History of cesarean delivery, currently        O78.219  pregnant  Encounter for antenatal screening for          Z36.3  malformations ---------------------------------------------------------------------- Fetal Evaluation  Num Of Fetuses:         1  Fetal Heart Rate(bpm):  157  Cardiac Activity:       Observed  Presentation:           Cephalic  Placenta:               Posterior  P. Cord Insertion:      Visualized, central  Amniotic Fluid  AFI FV:      Within normal limits  AFI Sum(cm)     %  Tile       Largest Pocket(cm)  9.97            13          3.84  RUQ(cm)       RLQ(cm)       LUQ(cm)        LLQ(cm)  3.84          3.72          0              2.41 ---------------------------------------------------------------------- Biometry  BPD:      74.1  mm     G. Age:  29w 5d         38  %    CI:        79.92   %    70 - 86                                                          FL/HC:      20.8   %    19.2 - 21.4  HC:      261.9  mm     G. Age:  28w 3d        2.3  %    HC/AC:      1.08        0.99 - 1.21  AC:      243.2  mm     G. Age:  28w 4d         15  %    FL/BPD:     73.4   %    71 - 87  FL:       54.4  mm     G. Age:  28w 5d         13  %    FL/AC:      22.4   %    20 - 24  LV:        3.7  mm  Est. FW:    1271  gm    2 lb 13 oz      11  % ---------------------------------------------------------------------- OB History  Gravidity:    4         Term:   2         SAB:   1  Living:       2 ---------------------------------------------------------------------- Gestational Age  LMP:           35w 2d        Date:  05/19/19                 EDD:   02/23/20  U/S Today:     28w 6d                                        EDD:   04/08/20  Best:          29w 5d     Det. By:  U/S  (12/23/19)          EDD:   04/02/20 ---------------------------------------------------------------------- Anatomy  Cranium:               Previously  seen  LVOT:                   Appears normal  Cavum:                 Previously seen        Aortic Arch:            Not well visualized  Ventricles:            Appears normal         Ductal Arch:            Not well visualized  Choroid Plexus:        Previously seen        Diaphragm:              Appears normal  Cerebellum:            Previously seen        Stomach:                Appears normal, left                                                                        sided  Posterior Fossa:       Previously seen        Abdomen:                Appears normal  Nuchal Fold:           Not applicable (>20    Abdominal Wall:         Previously seen                         wks GA)  Face:                  Orbits and profile     Cord Vessels:           Previously seen                         previously seen  Lips:                  Previously seen        Kidneys:                Appear normal  Palate:                Not well visualized    Bladder:                Appears normal  Thoracic:              Appears normal         Spine:                  Previously seen  Heart:                 Appears normal         Upper Extremities:      Previously seen                         (  4CH, axis, and                         situs)  RVOT:                  Not well visualized    Lower Extremities:      Previously seen  Other:  Normal genaitalia. Nasal bone visualized. ---------------------------------------------------------------------- Cervix Uterus Adnexa  Cervix  Not visualized (advanced GA >24wks) ---------------------------------------------------------------------- Impression  Patient returned for completion of fetal anatomy .  Her  pregnancy is dated by midtrimester ultrasound performed at  our office.  She does not have gestational diabetes.  Fetal growth is appropriate for gestational age .the estimated  fetal weight is at the 11th percentile.  Amniotic fluid is normal  and good fetal activity  is seen .  Cardiac anatomy could still  not be completed because of fetal position.  Placenta is  posterior and there is no evidence of previa or accreta.  Patient does not remember the birth weights of her previous  children.  Although there is no evidence of fetal growth restriction,  estimated fetal weight of 11th percentile requires a follow-up  growth assessment. ---------------------------------------------------------------------- Recommendations  -An appointment was made for her to return in 4 weeks for  fetal growth assessment. ----------------------------------------------------------------------                Noralee Spaceankar, MD Electronically Signed Final Report   01/21/2020 12:47 pm ----------------------------------------------------------------------    Assessment & Plan:  1) High-risk pregnancy [id number] at [redacted]w[redacted]d with an Estimated Date of Delivery: 04/02/20   2) Supervision of high risk pregnancy, antepartum  3) Near syncope - Suspects an abrupt drop in blood sugar after drinking glucola drink - VS stabilized after re-check  4) [redacted] weeks gestation of pregnancy   Meds: No orders of the defined types were placed in this encounter.   Labs/procedures today: 2 hr GTT & 3rd trimester labs  Treatment Plan:  Cold compress, flat positioning and continue laying down for remainder of appt  Reviewed: Preterm labor symptoms and general obstetric precautions including but not limited to vaginal bleeding, contractions, leaking of fluid and fetal movement were reviewed in detail with the patient.  All questions were answered.   Follow-up: Return in about 2 weeks (around 02/07/2020) for Return OB visit.  No orders of the defined types were placed in this encounter.  Raelyn Mora MSN, CNM 01/24/2020

## 2020-01-24 NOTE — Progress Notes (Signed)
Pt states was having stabbing pains for 3 days at the top of her stomach but it stopped after vomiting.This happened 4 days ago.

## 2020-01-25 LAB — GLUCOSE TOLERANCE, 2 HOURS W/ 1HR
Glucose, 1 hour: 171 mg/dL (ref 65–179)
Glucose, 2 hour: 130 mg/dL (ref 65–152)
Glucose, Fasting: 77 mg/dL (ref 65–91)

## 2020-01-25 LAB — CBC
Hematocrit: 34.4 % (ref 34.0–46.6)
Hemoglobin: 11.5 g/dL (ref 11.1–15.9)
MCH: 27.3 pg (ref 26.6–33.0)
MCHC: 33.4 g/dL (ref 31.5–35.7)
MCV: 82 fL (ref 79–97)
Platelets: 270 10*3/uL (ref 150–450)
RBC: 4.21 x10E6/uL (ref 3.77–5.28)
RDW: 11.9 % (ref 11.7–15.4)
WBC: 5.4 10*3/uL (ref 3.4–10.8)

## 2020-01-25 LAB — HIV ANTIBODY (ROUTINE TESTING W REFLEX): HIV Screen 4th Generation wRfx: NONREACTIVE

## 2020-01-25 LAB — RPR: RPR Ser Ql: NONREACTIVE

## 2020-01-31 NOTE — Patient Instructions (Signed)
Meagan Lee  01/31/2020   Your procedure is scheduled on:  02/16/2020  Arrive at 0730 at Entrance C on [address] at [facility]  and [address]. You are invited to use the FREE valet parking or use the Visitor's parking deck.  Pick up the phone at the desk and dial [phone].  Call this number if you have problems the morning of surgery: [phone]  Remember:   Do not eat food:(After Midnight) Desps de medianoche.  Do not drink clear liquids: (After Midnight) Desps de medianoche.  Take these medicines the morning of surgery with A SIP OF WATER:  none   Do not wear jewelry, make-up or nail polish.  Do not wear lotions, powders, or perfumes. Do not wear deodorant.  Do not shave 48 hours prior to surgery.  Do not bring valuables to the hospital.  [facility] is not   responsible for any belongings or valuables brought to the hospital.  Contacts, dentures or bridgework may not be worn into surgery.  Leave suitcase in the car. After surgery it may be brought to your room.  For patients admitted to the hospital, checkout time is 11:00 AM the day of              discharge.      Please read over the following fact sheets that you were given:     Preparing for Surgery

## 2020-02-01 NOTE — Pre-Procedure Instructions (Signed)
Interpreter number [phone]

## 2020-02-02 ENCOUNTER — Telehealth (HOSPITAL_COMMUNITY): Payer: Self-pay | Admitting: *Deleted

## 2020-02-02 NOTE — Telephone Encounter (Signed)
Preadmission screen  

## 2020-02-02 NOTE — Pre-Procedure Instructions (Signed)
Interpreter number [phone]

## 2020-02-03 ENCOUNTER — Telehealth (HOSPITAL_COMMUNITY): Payer: Self-pay | Admitting: *Deleted

## 2020-02-03 NOTE — Telephone Encounter (Signed)
Preadmission screen  

## 2020-02-03 NOTE — Pre-Procedure Instructions (Signed)
Interpreter number [phone]

## 2020-02-08 ENCOUNTER — Encounter: Payer: Self-pay | Admitting: Advanced Practice Midwife

## 2020-02-08 ENCOUNTER — Other Ambulatory Visit: Payer: Self-pay

## 2020-02-08 ENCOUNTER — Ambulatory Visit (INDEPENDENT_AMBULATORY_CARE_PROVIDER_SITE_OTHER): Payer: BC Managed Care – PPO | Admitting: Advanced Practice Midwife

## 2020-02-08 VITALS — BP 124/80 | HR 99 | Wt 179.8 lb

## 2020-02-08 DIAGNOSIS — O0990 Supervision of high risk pregnancy, unspecified, unspecified trimester: Secondary | ICD-10-CM

## 2020-02-08 LAB — POCT URINALYSIS DIP (DEVICE)
Glucose, UA: NEGATIVE mg/dL
Hgb urine dipstick: NEGATIVE
Leukocytes,Ua: NEGATIVE
Nitrite: NEGATIVE
Protein, ur: 100 mg/dL — AB
Specific Gravity, Urine: >=1.03 (ref 1.005–1.030)
Urobilinogen, UA: 0.2 mg/dL (ref 0.0–1.0)
pH: 6 (ref 5.0–8.0)

## 2020-02-08 NOTE — Progress Notes (Signed)
   PRENATAL VISIT NOTE  Subjective:  Meagan Lee is a 34 y.o. [id number] at [redacted]w[redacted]d being seen today for ongoing prenatal care.  She is currently monitored for the following issues for this high-risk pregnancy and has Prior c-section x 2 ; History of pre-eclampsia in prior pregnancy, currently pregnant; Infertility, female; Other fatigue; Vitamin D deficiency; New daily persistent headache; and Supervision of high risk pregnancy, antepartum on their problem list.  Patient reports no complaints.  Contractions: Not present. Vag. Bleeding: None.  Movement: Present. Denies leaking of fluid. Patient reports occasional HA but relieved by Tylenol.   The following portions of the patient's history were reviewed and updated as appropriate: allergies, current medications, past family history, past medical history, past social history, past surgical history and problem list.   Objective:   Vitals:   02/08/20 1031  BP: 124/80  Pulse: 99  Weight: 179 lb 12.8 oz (81.6 kg)    Fetal Status: Fetal Heart Rate (bpm): 154 Fundal Height: 31 cm Movement: Present     General:  Alert, oriented and cooperative. Patient is in no acute distress.  Skin: Skin is warm and dry. No rash noted.   Cardiovascular: Normal heart rate noted  Respiratory: Normal respiratory effort, no problems with respiration noted  Abdomen: Soft, gravid, appropriate for gestational age.  Pain/Pressure: Absent     Pelvic: Cervical exam deferred        Extremities: Normal range of motion.  Edema: Trace  Mental Status: Normal mood and affect. Normal behavior. Normal judgment and thought content.   Assessment and Plan:  Pregnancy: [id number] at [redacted]w[redacted]d 1. Supervision of high risk pregnancy, antepartum - H/o of pre-eclampsia, BP today 124/80 - No signs or symptoms of pre-eclampsia today. - Routine care and continue to monitor BP closely  2. 32 week prematurity - EDD is 04/03/2019, informed patient C-section will be rescheduled for later date  based on updated due date  Preterm labor symptoms and general obstetric precautions including but not limited to vaginal bleeding, contractions, leaking of fluid and fetal movement were reviewed in detail with the patient. Please refer to After Visit Summary for other counseling recommendations.   Return in about 2 weeks (around 02/22/2020).  Future Appointments  Date Time Provider Department Center  02/14/2020  9:00 AM [facility] PAT 1 [facility] None  02/14/2020 10:00 AM [facility] None  02/21/2020 10:30 AM [facility] NURSE [facility]  02/21/2020 10:45 AM [facility] US5 [facility]    Gigi Gin, Student-PA

## 2020-02-14 ENCOUNTER — Other Ambulatory Visit (HOSPITAL_COMMUNITY)
Admission: RE | Admit: 2020-02-14 | Discharge: 2020-02-14 | Disposition: A | Discharge status: Home / Self Care | Payer: BC Managed Care – PPO | Source: Ambulatory Visit | Attending: Obstetrics and Gynecology | Admitting: Obstetrics and Gynecology

## 2020-02-14 ENCOUNTER — Encounter (HOSPITAL_COMMUNITY): Payer: Self-pay | Admitting: Obstetrics and Gynecology

## 2020-02-14 ENCOUNTER — Encounter (HOSPITAL_COMMUNITY): Payer: Self-pay | Admitting: Family Medicine

## 2020-02-14 ENCOUNTER — Other Ambulatory Visit: Payer: Self-pay

## 2020-02-14 ENCOUNTER — Inpatient Hospital Stay (EMERGENCY_DEPARTMENT_HOSPITAL)
Admission: AD | Admit: 2020-02-14 | Discharge: 2020-02-15 | Disposition: A | Discharge status: Home / Self Care | Payer: BC Managed Care – PPO | Source: Home / Self Care | Attending: Family Medicine | Admitting: Family Medicine

## 2020-02-14 ENCOUNTER — Inpatient Hospital Stay (EMERGENCY_DEPARTMENT_HOSPITAL)
Admission: AD | Admit: 2020-02-14 | Discharge: 2020-02-14 | Disposition: A | Discharge status: Home / Self Care | Payer: BC Managed Care – PPO | Source: Home / Self Care | Attending: Family Medicine | Admitting: Family Medicine

## 2020-02-14 ENCOUNTER — Other Ambulatory Visit (HOSPITAL_COMMUNITY): Payer: BC Managed Care – PPO

## 2020-02-14 DIAGNOSIS — O0990 Supervision of high risk pregnancy, unspecified, unspecified trimester: Secondary | ICD-10-CM

## 2020-02-14 DIAGNOSIS — O09293 Supervision of pregnancy with other poor reproductive or obstetric history, third trimester: Secondary | ICD-10-CM | POA: Diagnosis not present

## 2020-02-14 DIAGNOSIS — Z3A33 33 weeks gestation of pregnancy: Secondary | ICD-10-CM | POA: Insufficient documentation

## 2020-02-14 DIAGNOSIS — O99353 Diseases of the nervous system complicating pregnancy, third trimester: Secondary | ICD-10-CM | POA: Insufficient documentation

## 2020-02-14 DIAGNOSIS — Z8249 Family history of ischemic heart disease and other diseases of the circulatory system: Secondary | ICD-10-CM | POA: Insufficient documentation

## 2020-02-14 DIAGNOSIS — O1493 Unspecified pre-eclampsia, third trimester: Secondary | ICD-10-CM

## 2020-02-14 DIAGNOSIS — G44209 Tension-type headache, unspecified, not intractable: Secondary | ICD-10-CM

## 2020-02-14 DIAGNOSIS — O0993 Supervision of high risk pregnancy, unspecified, third trimester: Secondary | ICD-10-CM | POA: Insufficient documentation

## 2020-02-14 DIAGNOSIS — Z3689 Encounter for other specified antenatal screening: Secondary | ICD-10-CM

## 2020-02-14 DIAGNOSIS — Z8759 Personal history of other complications of pregnancy, childbirth and the puerperium: Secondary | ICD-10-CM

## 2020-02-14 HISTORY — DX: Unspecified pre-eclampsia, third trimester: O14.93

## 2020-02-14 LAB — URINALYSIS, ROUTINE W REFLEX MICROSCOPIC
Bilirubin Urine: NEGATIVE
Glucose, UA: NEGATIVE mg/dL
Hgb urine dipstick: NEGATIVE
Ketones, ur: NEGATIVE mg/dL
Leukocytes,Ua: NEGATIVE
Nitrite: NEGATIVE
Protein, ur: 100 mg/dL — AB
Specific Gravity, Urine: 1.02 (ref 1.005–1.030)
pH: 5 (ref 5.0–8.0)

## 2020-02-14 LAB — COMPREHENSIVE METABOLIC PANEL
ALT: 10 U/L (ref 0–44)
AST: 16 U/L (ref 15–41)
Albumin: 2.5 g/dL — ABNORMAL LOW (ref 3.5–5.0)
Alkaline Phosphatase: 129 U/L — ABNORMAL HIGH (ref 38–126)
Anion gap: 10 (ref 5–15)
BUN: 9 mg/dL (ref 6–20)
CO2: 19 mmol/L — ABNORMAL LOW (ref 22–32)
Calcium: 8.5 mg/dL — ABNORMAL LOW (ref 8.9–10.3)
Chloride: 107 mmol/L (ref 98–111)
Creatinine, Ser: 0.58 mg/dL (ref 0.44–1.00)
GFR, Estimated: >60 mL/min (ref >60)
Glucose, Bld: 103 mg/dL — ABNORMAL HIGH (ref 70–99)
Potassium: 3.8 mmol/L (ref 3.5–5.1)
Sodium: 136 mmol/L (ref 135–145)
Total Bilirubin: 0.6 mg/dL (ref 0.3–1.2)
Total Protein: 5.8 g/dL — ABNORMAL LOW (ref 6.5–8.1)

## 2020-02-14 LAB — PROTEIN / CREATININE RATIO, URINE
Creatinine, Urine: 190.89 mg/dL
Protein Creatinine Ratio: 2.54 mg/mg{Cre} — ABNORMAL HIGH (ref 0.00–0.15)
Total Protein, Urine: 485 mg/dL

## 2020-02-14 LAB — CBC
HCT: 32.1 % — ABNORMAL LOW (ref 36.0–46.0)
Hemoglobin: 10.3 g/dL — ABNORMAL LOW (ref 12.0–15.0)
MCH: 26.1 pg (ref 26.0–34.0)
MCHC: 32.1 g/dL (ref 30.0–36.0)
MCV: 81.5 fL (ref 80.0–100.0)
Platelets: 310 10*3/uL (ref 150–400)
RBC: 3.94 MIL/uL (ref 3.87–5.11)
RDW: 12.4 % (ref 11.5–15.5)
WBC: 7 10*3/uL (ref 4.0–10.5)
nRBC: 0 % (ref 0.0–0.2)

## 2020-02-14 MED ORDER — BUTALBITAL-APAP-CAFFEINE 50-325-40 MG PO TABS
1.0000 | ORAL_TABLET | Freq: Four times a day (QID) | ORAL | Status: DC | PRN
  Administered 2020-02-14: 1 via ORAL
  Filled 2020-02-14: qty 1

## 2020-02-14 NOTE — Discharge Instructions (Signed)
Preeclampsia and Eclampsia Preeclampsia is a serious condition that may develop during pregnancy. This condition causes high blood pressure and increased protein in your urine along with other symptoms, such as headaches and vision changes. These symptoms may develop as the condition gets worse. Preeclampsia may occur at 20 weeks of pregnancy or later. Diagnosing and treating preeclampsia early is very important. If not treated early, it can cause serious problems for you and your baby. One problem it can lead to is eclampsia. Eclampsia is a condition that causes muscle jerking or shaking (convulsions or seizures) and other serious problems for the mother. During pregnancy, delivering your baby may be the best treatment for preeclampsia or eclampsia. For most women, preeclampsia and eclampsia symptoms go away after giving birth. In rare cases, a woman may develop preeclampsia after giving birth (postpartum preeclampsia). This usually occurs within 48 hours after childbirth but may occur up to 6 weeks after giving birth. What are the causes? The cause of preeclampsia is not known. What increases the risk? The following risk factors make you more likely to develop preeclampsia:  Being pregnant for the first time.  Having had preeclampsia during a past pregnancy.  Having a family history of preeclampsia.  Having high blood pressure.  Being pregnant with more than one baby.  Being 35 or older.  Being African-American.  Having kidney disease or diabetes.  Having medical conditions such as lupus or blood diseases.  Being very overweight (obese). What are the signs or symptoms? The most common symptoms are:  Severe headaches.  Vision problems, such as blurred or double vision.  Abdominal pain, especially upper abdominal pain. Other symptoms that may develop as the condition gets worse include:  Sudden weight gain.  Sudden swelling of the hands, face, legs, and feet.  Severe nausea  and vomiting.  Numbness in the face, arms, legs, and feet.  Dizziness.  Urinating less than usual.  Slurred speech.  Convulsions or seizures. How is this diagnosed? There are no screening tests for preeclampsia. Your health care provider will ask you about symptoms and check for signs of preeclampsia during your prenatal visits. You may also have tests that include:  Checking your blood pressure.  Urine tests to check for protein. Your health care provider will check for this at every prenatal visit.  Blood tests.  Monitoring your baby's heart rate.  Ultrasound. How is this treated? You and your health care provider will determine the treatment approach that is best for you. Treatment may include:  Having more frequent prenatal exams to check for signs of preeclampsia, if you have an increased risk for preeclampsia.  Medicine to lower your blood pressure.  Staying in the hospital, if your condition is severe. There, treatment will focus on controlling your blood pressure and the amount of fluids in your body (fluid retention).  Taking medicine (magnesium sulfate) to prevent seizures. This may be given as an injection or through an IV.  Taking a low-dose aspirin during your pregnancy.  Delivering your baby early. You may have your labor started with medicine (induced), or you may have a cesarean delivery. Follow these instructions at home: Eating and drinking   Drink enough fluid to keep your urine pale yellow.  Avoid caffeine. Lifestyle  Do not use any products that contain nicotine or tobacco, such as cigarettes and e-cigarettes. If you need help quitting, ask your health care provider.  Do not use alcohol or drugs.  Avoid stress as much as possible. Rest and get   plenty of sleep. General instructions  Take over-the-counter and prescription medicines only as told by your health care provider.  When lying down, lie on your left side. This keeps pressure off your  major blood vessels.  When sitting or lying down, raise (elevate) your feet. Try putting some pillows underneath your lower legs.  Exercise regularly. Ask your health care provider what kinds of exercise are best for you.  Keep all follow-up and prenatal visits as told by your health care provider. This is important. How is this prevented? There is no known way of preventing preeclampsia or eclampsia from developing. However, to lower your risk of complications and detect problems early:  Get regular prenatal care. Your health care provider may be able to diagnose and treat the condition early.  Maintain a healthy weight. Ask your health care provider for help managing weight gain during pregnancy.  Work with your health care provider to manage any long-term (chronic) health conditions you have, such as diabetes or kidney problems.  You may have tests of your blood pressure and kidney function after giving birth.  Your health care provider may have you take low-dose aspirin during your next pregnancy. Contact a health care provider if:  You have symptoms that your health care provider told you may require more treatment or monitoring, such as: ? Headaches. ? Nausea or vomiting. ? Abdominal pain. ? Dizziness. ? Light-headedness. Get help right away if:  You have severe: ? Abdominal pain. ? Headaches that do not get better. ? Dizziness. ? Vision problems. ? Confusion. ? Nausea or vomiting.  You have any of the following: ? A seizure. ? Sudden, rapid weight gain. ? Sudden swelling in your hands, ankles, or face. ? Trouble moving any part of your body. ? Numbness in any part of your body. ? Trouble speaking. ? Abnormal bleeding.  You faint. Summary  Preeclampsia is a serious condition that may develop during pregnancy.  This condition causes high blood pressure and increased protein in your urine along with other symptoms, such as headaches and vision  changes.  Diagnosing and treating preeclampsia early is very important. If not treated early, it can cause serious problems for you and your baby.  Get help right away if you have symptoms that your health care provider told you to watch for. This information is not intended to replace advice given to you by your health care provider. Make sure you discuss any questions you have with your health care provider. Document Revised: 11/04/2017 Document Reviewed: 10/09/2015 Elsevier Patient Education  2020 [employer]  

## 2020-02-14 NOTE — MAU Note (Signed)
. °  Meagan Lee is a 34 y.o. at [redacted]w[redacted]d here in MAU reporting: Was seen in MAU earlier today with elevated BP. States she took it at home and it was 141/95 and her headache is back. No VB or LOF. Endorses good fetal movement. Has not had anything for her headache since she left earlier.   Pain score: 7 Vitals:   02/14/20 1920  BP: (!) 141/86  Pulse: 100  Resp: 17  Temp: 98.3 F (36.8 C)  SpO2: 100%     FHT:141

## 2020-02-14 NOTE — H&P (Deleted)
OBSTETRIC ADMISSION HISTORY AND PHYSICAL  Meagan Lee is a 34 y.o. female [phone] with IUP at [redacted]w[redacted]d presenting for preeclampsia. She was seen earlier today in MAU and found to have HTN and PCR of 2.54. She had a HA which responded to Fioricet. She returned to the MAU a few hours later because her HA came back. HA located frontal and right peri-orbital. Rates pain 5/10. She has not treated it. She reports +FMs. No LOF, VB, blurry vision, peripheral edema, or RUQ pain. She plans on breastfeeding.   Dating: By LMP --->  Estimated Date of Delivery: 04/02/20  Sono:    @[redacted]w[redacted]d , normal anatomy, ceph presentation, 1271g, 11%ile, EFW 2'13  Prenatal History/Complications: - previous CS x2 - alpha thal carrier - preeclampsia in previous pregnancies  Past Medical History: Past Medical History:  Diagnosis Date  . Missed ab 01/18/2019    Past Surgical History: Past Surgical History:  Procedure Laterality Date  . CESAREAN SECTION     x2    Obstetrical History: OB History    Gravida  4   Para  2   Term  2   Preterm      AB  1   Living  2     SAB  1   TAB      Ectopic      Multiple      Live Births  2           Social History: Social History   Socioeconomic History  . Marital status: Married    Spouse name: Not on file  . Number of children: Not on file  . Years of education: Not on file  . Highest education level: Not on file  Occupational History  . Not on file  Tobacco Use  . Smoking status: Never Smoker  . Smokeless tobacco: Never Used  Vaping Use  . Vaping Use: Never used  Substance and Sexual Activity  . Alcohol use: No  . Drug use: No  . Sexual activity: Yes    Birth control/protection: None  Other Topics Concern  . Not on file  Social History Narrative  . Not on file   Social Determinants of Health   Financial Resource Strain:   . Difficulty of Paying Living Expenses: Not on file  Food Insecurity: No Food Insecurity  . Worried About  13/04/2018 in the Last Year: Never true  . Ran Out of Food in the Last Year: Never true  Transportation Needs: No Transportation Needs  . Lack of Transportation (Medical): No  . Lack of Transportation (Non-Medical): No  Physical Activity:   . Days of Exercise per Week: Not on file  . Minutes of Exercise per Session: Not on file  Stress:   . Feeling of Stress : Not on file  Social Connections: Socially Integrated  . Frequency of Communication with Friends and Family: More than three times a week  . Frequency of Social Gatherings with Friends and Family: More than three times a week  . Attends Religious Services: More than 4 times per year  . Active Member of Clubs or Organizations: Yes  . Attends Programme researcher, broadcasting/film/video Meetings: More than 4 times per year  . Marital Status: Married    Family History: Family History  Problem Relation Age of Onset  . Hypertension Maternal Grandmother   . Asthma Maternal Grandfather   . Hypertension Paternal Aunt   . Hypertension Paternal Uncle     Allergies: No Known Allergies  Medications Prior to Admission  Medication Sig Dispense Refill Last Dose  . butalbital-acetaminophen-caffeine (FIORICET) 50-325-40 MG tablet Take 1-2 tablets by mouth every 6 (six) hours as needed for headache. 20 tablet 0 02/14/2020 at 1430  . acetaminophen (TYLENOL) 650 MG CR tablet Take 650 mg by mouth every 8 (eight) hours as needed for pain.     . famotidine (PEPCID) 40 MG tablet Take 1 tablet (40 mg total) by mouth daily. (Patient not taking: Reported on 12/23/2019) 30 tablet 11   . Magnesium 400 MG TABS Take 1 tablet by mouth daily. (Patient not taking: Reported on 01/21/2020) 60 tablet 3   . Prenatal Vit-Fe Fumarate-FA (CLASSIC PRENATAL) 28-0.8 MG TABS Take 1 tablet by mouth daily.        Review of Systems:  All systems reviewed and negative except as stated in HPI  PE: Blood pressure 125/60, pulse 86, temperature 98.3 F (36.8 C), temperature source  Oral, resp. rate 17, height 5\' 4"  (1.626 m), weight 84 kg, last menstrual period 05/19/2019, SpO2 100 %, unknown if currently breastfeeding. General appearance: alert, cooperative and no distress Lungs: regular rate and effort Heart: regular rate  Abdomen: soft, non-tender Extremities: Homans sign is negative, no sign of DVT EFM: 145 bpm, mod variability, + accels, no decels Toco: none  Prenatal labs: ABO, Rh: O/Positive/-- (09/22 1645) Antibody: Negative (09/22 1645) Rubella: 18.50 (09/22 1645) RPR: Non Reactive (11/08 0845)  HBsAg: Negative (09/22 1645)  HIV: Non Reactive (11/08 0845)  GBS:    2 hr GTT normal  Prenatal Transfer Tool  Maternal Diabetes: No Genetic Screening: Normal Maternal Ultrasounds/Referrals: Normal Fetal Ultrasounds or other Referrals:  None Maternal Substance Abuse:  No Significant Maternal Medications:  None Significant Maternal Lab Results: None  Results for orders placed or performed during the hospital encounter of 02/14/20 (from the past 24 hour(s))  Urinalysis, Routine w reflex microscopic Urine, Clean Catch   Collection Time: 02/14/20  1:37 PM  Result Value Ref Range   Color, Urine YELLOW YELLOW   APPearance CLOUDY (A) CLEAR   Specific Gravity, Urine 1.020 1.005 - 1.030   pH 5.0 5.0 - 8.0   Glucose, UA NEGATIVE NEGATIVE mg/dL   Hgb urine dipstick NEGATIVE NEGATIVE   Bilirubin Urine NEGATIVE NEGATIVE   Ketones, ur NEGATIVE NEGATIVE mg/dL   Protein, ur 02/16/20 (A) NEGATIVE mg/dL   Nitrite NEGATIVE NEGATIVE   Leukocytes,Ua NEGATIVE NEGATIVE   RBC / HPF 0-5 0 - 5 RBC/hpf   WBC, UA 0-5 0 - 5 WBC/hpf   Bacteria, UA RARE (A) NONE SEEN   Squamous Epithelial / LPF 11-20 0 - 5   Mucus PRESENT    Granular Casts, UA PRESENT   Protein / creatinine ratio, urine   Collection Time: 02/14/20  1:37 PM  Result Value Ref Range   Creatinine, Urine 190.89 mg/dL   Total Protein, Urine 485 mg/dL   Protein Creatinine Ratio 2.54 (H) 0.00 - 0.15 mg/mg[Cre]  CBC    Collection Time: 02/14/20  1:47 PM  Result Value Ref Range   WBC 7.0 4.0 - 10.5 K/uL   RBC 3.94 3.87 - 5.11 MIL/uL   Hemoglobin 10.3 (L) 12.0 - 15.0 g/dL   HCT 02/16/20 (L) 36 - 46 %   MCV 81.5 80.0 - 100.0 fL   MCH 26.1 26.0 - 34.0 pg   MCHC 32.1 30.0 - 36.0 g/dL   RDW 19.5 09.3 - 26.7 %   Platelets 310 150 - 400 K/uL   nRBC 0.0 0.0 -  0.2 %  Comprehensive metabolic panel   Collection Time: 02/14/20  1:47 PM  Result Value Ref Range   Sodium 136 135 - 145 mmol/L   Potassium 3.8 3.5 - 5.1 mmol/L   Chloride 107 98 - 111 mmol/L   CO2 19 (L) 22 - 32 mmol/L   Glucose, Bld 103 (H) 70 - 99 mg/dL   BUN 9 6 - 20 mg/dL   Creatinine, Ser 5.28 0.44 - 1.00 mg/dL   Calcium 8.5 (L) 8.9 - 10.3 mg/dL   Total Protein 5.8 (L) 6.5 - 8.1 g/dL   Albumin 2.5 (L) 3.5 - 5.0 g/dL   AST 16 15 - 41 U/L   ALT 10 0 - 44 U/L   Alkaline Phosphatase 129 (H) 38 - 126 U/L   Total Bilirubin 0.6 0.3 - 1.2 mg/dL   GFR, Estimated >41 >32 mL/min   Anion gap 10 5 - 15    Patient Active Problem List   Diagnosis Date Noted  . History of pre-eclampsia 02/14/2020  . Preeclampsia, third trimester 02/14/2020  . Supervision of high risk pregnancy, antepartum 12/08/2019  . Infertility, female 07/07/2019  . Other fatigue 07/07/2019  . Vitamin D deficiency 07/07/2019  . New daily persistent headache 07/07/2019  . Prior c-section x 2  01/11/2019  . History of pre-eclampsia in prior pregnancy, currently pregnant 01/11/2019    Assessment:    Plan: Admit to   Donette Larry, CNM  02/14/2020, 9:07 PM

## 2020-02-14 NOTE — MAU Provider Note (Signed)
History     CSN: [id number]  Arrival date and time: 02/14/20 1238   First Provider Initiated Contact with Patient 02/14/20 1356      Chief Complaint  Patient presents with  . BP Evaluation   34 y.o. [id number] @33 .1 wks presenting with elevated BP. Reports onset of HA yesterday, checked BP at home as was normal. HA recurred today and home BP was 156/105. HA is located frontal. Rates pain 8/10. Denies visual disturbances, RUQ pain, SOB, and CP. States her "body feels heavy". Reports good FM. No VB, LOF, ctx. Hx of PEC in both previous pregnancies.     OB History    Gravida  4   Para  2   Term  2   Preterm      AB  1   Living  2     SAB  1   TAB      Ectopic      Multiple      Live Births  2           Past Medical History:  Diagnosis Date  . Missed ab 01/18/2019    Past Surgical History:  Procedure Laterality Date  . CESAREAN SECTION     x2    Family History  Problem Relation Age of Onset  . Hypertension Maternal Grandmother   . Asthma Maternal Grandfather   . Hypertension Paternal Aunt   . Hypertension Paternal Uncle     Social History   Tobacco Use  . Smoking status: Never Smoker  . Smokeless tobacco: Never Used  Vaping Use  . Vaping Use: Never used  Substance Use Topics  . Alcohol use: No  . Drug use: No    Allergies: No Known Allergies  Medications Prior to Admission  Medication Sig Dispense Refill Last Dose  . acetaminophen (TYLENOL) 650 MG CR tablet Take 650 mg by mouth every 8 (eight) hours as needed for pain.   02/13/2020 at Unknown time  . Prenatal Vit-Fe Fumarate-FA (CLASSIC PRENATAL) 28-0.8 MG TABS Take 1 tablet by mouth daily.   Past Week at Unknown time  . butalbital-acetaminophen-caffeine (FIORICET) 50-325-40 MG tablet Take 1-2 tablets by mouth every 6 (six) hours as needed for headache. (Patient not taking: Reported on 02/08/2020) 20 tablet 0   . famotidine (PEPCID) 40 MG tablet Take 1 tablet (40 mg total) by mouth daily.  (Patient not taking: Reported on 12/23/2019) 30 tablet 11   . Magnesium 400 MG TABS Take 1 tablet by mouth daily. (Patient not taking: Reported on 01/21/2020) 60 tablet 3   . Prenatal Vit-Fe Fumarate-FA (PRENATAL VITAMIN) 27-0.8 MG TABS Take 1 tablet by mouth daily. (Patient not taking: Reported on 02/09/2020) 30 tablet 11     Review of Systems  Eyes: Negative for visual disturbance.  Respiratory: Negative for shortness of breath.   Cardiovascular: Negative for chest pain.  Gastrointestinal: Negative for abdominal pain.  Genitourinary: Negative for vaginal bleeding.  Neurological: Positive for headaches.   Physical Exam   Blood pressure (!) 154/91, pulse 82, temperature 98.3 F (36.8 C), temperature source Oral, resp. rate 18, last menstrual period 05/19/2019, SpO2 100 %, unknown if currently breastfeeding. Patient Vitals for the past 24 hrs:  BP Temp Temp src Pulse Resp SpO2  02/14/20 1531 (!) 154/91 -- -- 82 -- --  02/14/20 1530 -- -- -- -- -- 100 %  02/14/20 1525 -- -- -- -- -- 100 %  02/14/20 1520 -- -- -- -- -- 100 %  02/14/20  1516 140/89 -- -- 83 -- --  02/14/20 1510 -- -- -- -- -- 100 %  02/14/20 1505 -- -- -- -- -- 100 %  02/14/20 1501 133/83 -- -- 79 18 --  02/14/20 1500 -- -- -- -- -- 100 %  02/14/20 1455 -- -- -- -- -- 100 %  02/14/20 1450 -- -- -- -- -- 100 %  02/14/20 1446 131/79 -- -- 78 17 --  02/14/20 1445 -- -- -- -- -- 100 %  02/14/20 1440 -- -- -- -- -- 100 %  02/14/20 1435 -- -- -- -- -- 100 %  02/14/20 1431 127/81 -- -- 84 -- --  02/14/20 1430 127/81 -- -- 84 -- 99 %  02/14/20 1425 -- -- -- -- -- 99 %  02/14/20 1420 -- -- -- -- -- 99 %  02/14/20 1416 132/79 -- -- 83 -- --  02/14/20 1415 -- -- -- -- -- 99 %  02/14/20 1410 -- -- -- -- -- 99 %  02/14/20 1405 -- -- -- -- -- 99 %  02/14/20 1401 140/77 -- -- 83 -- --  02/14/20 1400 -- -- -- -- -- 100 %  02/14/20 1355 -- -- -- -- -- 99 %  02/14/20 1350 -- -- -- -- -- 100 %  02/14/20 1346 136/83 -- -- 91 -- --   02/14/20 1335 -- -- -- -- -- 99 %  02/14/20 1331 137/87 -- -- 97 -- 99 %  02/14/20 1330 137/87 -- -- 97 -- 99 %  02/14/20 1328 138/82 -- -- 97 -- --  02/14/20 1312 (!) 145/67 98.3 F (36.8 C) Oral (!) 105 20 98 %    Physical Exam Vitals and nursing note reviewed.  Constitutional:      General: She is not in acute distress.    Appearance: Normal appearance.  HENT:     Head: Normocephalic and atraumatic.  Cardiovascular:     Rate and Rhythm: Normal rate.  Pulmonary:     Effort: Pulmonary effort is normal. No respiratory distress.  Musculoskeletal:        General: No swelling. Normal range of motion.     Cervical back: Normal range of motion.  Skin:    General: Skin is warm and dry.  Neurological:     General: No focal deficit present.     Mental Status: She is alert and oriented to person, place, and time.  Psychiatric:        Mood and Affect: Mood normal.        Behavior: Behavior normal.   EFM: 135 bpm, mod variability, + accels, no decels Toco: none  Results for orders placed or performed during the hospital encounter of 02/14/20 (from the past 24 hour(s))  Urinalysis, Routine w reflex microscopic Urine, Clean Catch     Status: Abnormal   Collection Time: 02/14/20  1:37 PM  Result Value Ref Range   Color, Urine YELLOW YELLOW   APPearance CLOUDY (A) CLEAR   Specific Gravity, Urine 1.020 1.005 - 1.030   pH 5.0 5.0 - 8.0   Glucose, UA NEGATIVE NEGATIVE mg/dL   Hgb urine dipstick NEGATIVE NEGATIVE   Bilirubin Urine NEGATIVE NEGATIVE   Ketones, ur NEGATIVE NEGATIVE mg/dL   Protein, ur 989 (A) NEGATIVE mg/dL   Nitrite NEGATIVE NEGATIVE   Leukocytes,Ua NEGATIVE NEGATIVE   RBC / HPF 0-5 0 - 5 RBC/hpf   WBC, UA 0-5 0 - 5 WBC/hpf   Bacteria, UA RARE (A) NONE SEEN  Squamous Epithelial / LPF 11-20 0 - 5   Mucus PRESENT    Granular Casts, UA PRESENT   Protein / creatinine ratio, urine     Status: Abnormal   Collection Time: 02/14/20  1:37 PM  Result Value Ref Range    Creatinine, Urine 190.89 mg/dL   Total Protein, Urine 485 mg/dL   Protein Creatinine Ratio 2.54 (H) 0.00 - 0.15 mg/mg[Cre]  CBC     Status: Abnormal   Collection Time: 02/14/20  1:47 PM  Result Value Ref Range   WBC 7.0 4.0 - 10.5 K/uL   RBC 3.94 3.87 - 5.11 MIL/uL   Hemoglobin 10.3 (L) 12.0 - 15.0 g/dL   HCT 24.2 (L) 36 - 46 %   MCV 81.5 80.0 - 100.0 fL   MCH 26.1 26.0 - 34.0 pg   MCHC 32.1 30.0 - 36.0 g/dL   RDW 35.3 61.4 - 43.1 %   Platelets 310 150 - 400 K/uL   nRBC 0.0 0.0 - 0.2 %  Comprehensive metabolic panel     Status: Abnormal   Collection Time: 02/14/20  1:47 PM  Result Value Ref Range   Sodium 136 135 - 145 mmol/L   Potassium 3.8 3.5 - 5.1 mmol/L   Chloride 107 98 - 111 mmol/L   CO2 19 (L) 22 - 32 mmol/L   Glucose, Bld 103 (H) 70 - 99 mg/dL   BUN 9 6 - 20 mg/dL   Creatinine, Ser 5.40 0.44 - 1.00 mg/dL   Calcium 8.5 (L) 8.9 - 10.3 mg/dL   Total Protein 5.8 (L) 6.5 - 8.1 g/dL   Albumin 2.5 (L) 3.5 - 5.0 g/dL   AST 16 15 - 41 U/L   ALT 10 0 - 44 U/L   Alkaline Phosphatase 129 (H) 38 - 126 U/L   Total Bilirubin 0.6 0.3 - 1.2 mg/dL   GFR, Estimated >08 >67 mL/min   Anion gap 10 5 - 15   MAU Course  Procedures Fioricet  MDM Labs ordered and reviewed. Meets criteria for PEC, no severe features. HA improved with meds. Discussed presentation and findings with Dr. Adrian Blackwater, plan for outpt f/u. Discussed findings and plan with pt and spouse, all questions answered. Stable for discharge home.  Assessment and Plan   1. [redacted] weeks gestation of pregnancy   2. Supervision of high risk pregnancy, antepartum   3. NST (non-stress test) reactive   4. Preeclampsia, third trimester   5. Acute non intractable tension-type headache    Discharge home Follow up at [facility] this week for ROB/BP check- message sent Strict PEC precautions  Allergies as of 02/14/2020   No Known Allergies     Medication List    TAKE these medications   acetaminophen 650 MG CR tablet Commonly known  as: TYLENOL Take 650 mg by mouth every 8 (eight) hours as needed for pain.   butalbital-acetaminophen-caffeine 50-325-40 MG tablet Commonly known as: FIORICET Take 1-2 tablets by mouth every 6 (six) hours as needed for headache.   Classic Prenatal 28-0.8 MG Tabs Take 1 tablet by mouth daily. What changed: Another medication with the same name was removed. Continue taking this medication, and follow the directions you see here.   famotidine 40 MG tablet Commonly known as: Pepcid Take 1 tablet (40 mg total) by mouth daily.   Magnesium 400 MG Tabs Take 1 tablet by mouth daily.      Donette Larry, CNM 02/14/2020, 3:34 PM

## 2020-02-14 NOTE — MAU Note (Signed)
Presents for BP evaluation, reports took BP @ home, BP 156/105.  Reports currently has H/A, hasn't treated with meds.  Denies visual disturbances and epigastric pain.  Endorses +FM.  Denies VB or LOF.

## 2020-02-15 ENCOUNTER — Encounter (HOSPITAL_COMMUNITY): Payer: Self-pay | Admitting: Obstetrics and Gynecology

## 2020-02-15 DIAGNOSIS — Z3A33 33 weeks gestation of pregnancy: Secondary | ICD-10-CM | POA: Diagnosis not present

## 2020-02-15 DIAGNOSIS — O09293 Supervision of pregnancy with other poor reproductive or obstetric history, third trimester: Secondary | ICD-10-CM

## 2020-02-15 DIAGNOSIS — O1493 Unspecified pre-eclampsia, third trimester: Secondary | ICD-10-CM | POA: Diagnosis not present

## 2020-02-15 LAB — PROTEIN / CREATININE RATIO, URINE
Creatinine, Urine: 315.9 mg/dL
Protein Creatinine Ratio: 0.47 mg/mg{Cre} — ABNORMAL HIGH (ref 0.00–0.15)
Total Protein, Urine: 149 mg/dL

## 2020-02-15 LAB — COMPREHENSIVE METABOLIC PANEL
ALT: 6 U/L (ref 0–44)
AST: 15 U/L (ref 15–41)
Albumin: 2.4 g/dL — ABNORMAL LOW (ref 3.5–5.0)
Alkaline Phosphatase: 129 U/L — ABNORMAL HIGH (ref 38–126)
Anion gap: 10 (ref 5–15)
BUN: 11 mg/dL (ref 6–20)
CO2: 19 mmol/L — ABNORMAL LOW (ref 22–32)
Calcium: 8.5 mg/dL — ABNORMAL LOW (ref 8.9–10.3)
Chloride: 108 mmol/L (ref 98–111)
Creatinine, Ser: 0.54 mg/dL (ref 0.44–1.00)
GFR, Estimated: >60 mL/min (ref >60)
Glucose, Bld: 88 mg/dL (ref 70–99)
Potassium: 4 mmol/L (ref 3.5–5.1)
Sodium: 137 mmol/L (ref 135–145)
Total Bilirubin: 0.4 mg/dL (ref 0.3–1.2)
Total Protein: 5.6 g/dL — ABNORMAL LOW (ref 6.5–8.1)

## 2020-02-15 LAB — CBC
HCT: 31.7 % — ABNORMAL LOW (ref 36.0–46.0)
Hemoglobin: 10.2 g/dL — ABNORMAL LOW (ref 12.0–15.0)
MCH: 26.7 pg (ref 26.0–34.0)
MCHC: 32.2 g/dL (ref 30.0–36.0)
MCV: 83 fL (ref 80.0–100.0)
Platelets: 262 10*3/uL (ref 150–400)
RBC: 3.82 MIL/uL — ABNORMAL LOW (ref 3.87–5.11)
RDW: 12.6 % (ref 11.5–15.5)
WBC: 7.7 10*3/uL (ref 4.0–10.5)
nRBC: 0 % (ref 0.0–0.2)

## 2020-02-15 MED ORDER — DEXAMETHASONE SODIUM PHOSPHATE 10 MG/ML IJ SOLN
10.0000 mg | Freq: Once | INTRAMUSCULAR | Status: AC
  Administered 2020-02-15: 10 mg via INTRAVENOUS
  Filled 2020-02-15: qty 1

## 2020-02-15 MED ORDER — SODIUM CHLORIDE 0.9 % IV SOLN
INTRAVENOUS | Status: DC
Start: 2020-02-15 — End: 2020-02-15

## 2020-02-15 MED ORDER — DIPHENHYDRAMINE HCL 50 MG/ML IJ SOLN
25.0000 mg | Freq: Once | INTRAMUSCULAR | Status: AC
  Administered 2020-02-15: 25 mg via INTRAVENOUS
  Filled 2020-02-15: qty 1

## 2020-02-15 MED ORDER — METOCLOPRAMIDE HCL 5 MG/ML IJ SOLN
10.0000 mg | Freq: Once | INTRAMUSCULAR | Status: AC
  Administered 2020-02-15: 10 mg via INTRAVENOUS
  Filled 2020-02-15: qty 2

## 2020-02-15 MED ORDER — LACTATED RINGERS IV BOLUS: 1000.0000 mL | Freq: Once | INTRAVENOUS | Status: DC

## 2020-02-15 NOTE — MAU Provider Note (Signed)
History     CSN: [id number]  Arrival date and time: 02/14/20 4098   First Provider Initiated Contact with Patient 02/14/20 1946      Chief Complaint  Patient presents with  . Hypertension  . Headache   Meagan Lee is a 34 y.o. female [phone] with IUP at [redacted]w[redacted]d presenting for preeclampsia. She was seen earlier today in MAU and found to have HTN and PCR of 2.54. She had a HA which responded to Fioricet. She returned to the MAU a few hours later because her HA came back. HA located frontal and right peri-orbital. Rates pain 5/10. She has not treated it. She reports +FMs. No LOF, VB, blurry vision, peripheral edema, or RUQ pain. She plans on breastfeeding.    OB History    Gravida  4   Para  2   Term  2   Preterm      AB  1   Living  2     SAB  1   TAB      Ectopic      Multiple      Live Births  2           History reviewed. No pertinent past medical history.  Past Surgical History:  Procedure Laterality Date  . CESAREAN SECTION     x2    Family History  Problem Relation Age of Onset  . Hypertension Maternal Grandmother   . Asthma Maternal Grandfather   . Hypertension Paternal Aunt   . Hypertension Paternal Uncle     Social History   Tobacco Use  . Smoking status: Never Smoker  . Smokeless tobacco: Never Used  Vaping Use  . Vaping Use: Never used  Substance Use Topics  . Alcohol use: No  . Drug use: No    Allergies:  Allergies  Allergen Reactions  . Dexamethasone Anaphylaxis    Medications Prior to Admission  Medication Sig Dispense Refill Last Dose  . butalbital-acetaminophen-caffeine (FIORICET) 50-325-40 MG tablet Take 1-2 tablets by mouth every 6 (six) hours as needed for headache. 20 tablet 0 02/14/2020 at 1430  . acetaminophen (TYLENOL) 650 MG CR tablet Take 650 mg by mouth every 8 (eight) hours as needed for pain.     . famotidine (PEPCID) 40 MG tablet Take 1 tablet (40 mg total) by mouth daily. (Patient not taking: Reported on  12/23/2019) 30 tablet 11   . Magnesium 400 MG TABS Take 1 tablet by mouth daily. (Patient not taking: Reported on 01/21/2020) 60 tablet 3   . Prenatal Vit-Fe Fumarate-FA (CLASSIC PRENATAL) 28-0.8 MG TABS Take 1 tablet by mouth daily.       Review of Systems  Constitutional: Negative.   Eyes: Negative for visual disturbance.  Respiratory: Negative.   Cardiovascular: Negative.   Gastrointestinal: Negative.   Genitourinary: Negative.   Musculoskeletal: Negative.   Neurological: Positive for headaches. Negative for dizziness, seizures, weakness and light-headedness.   Physical Exam   Patient Vitals for the past 24 hrs:  BP Temp Temp src Pulse Resp SpO2 Height Weight  02/15/20 0050 - - - - - 100 % - -  02/15/20 0042 - - - - - 100 % - -  02/15/20 0040 - - - - - 100 % - -  02/15/20 0035 - - - - - 100 % - -  02/15/20 0030 - - - - - 100 % - -  02/15/20 0029 135/85 - - 87 - - - -  02/15/20 0016 113/64 - -  76 - - - -  02/14/20 2346 (!) 110/51 - - 79 - - - -  02/14/20 2331 (!) 112/50 - - 80 - - - -  02/14/20 2316 (!) 104/48 - - 82 - - - -  02/14/20 2301 (!) 104/48 - - 84 - - - -  02/14/20 2246 (!) 120/54 - - 81 - - - -  02/14/20 2231 125/70 - - 80 - - - -  02/14/20 2216 116/62 - - 72 - - - -  02/14/20 2201 122/68 - - 81 - - - -  02/14/20 2146 (!) 110/42 - - 89 - - - -  02/14/20 2131 (!) 119/56 - - 79 - - - -  02/14/20 2116 119/72 - - 86 - - - -  02/14/20 2105 - - - - - 100 % - -  02/14/20 2101 125/60 - - 86 - - - -  02/14/20 2100 - - - - - 99 % - -  02/14/20 2057 - - - - - 100 % - -  02/14/20 2055 - - - - - 100 % - -  02/14/20 2050 - - - - - 100 % - -  02/14/20 2048 - - - - - 100 % - -  02/14/20 2046 120/65 - - 86 - - - -  02/14/20 2030 (!) 124/49 - - 84 - 100 % - -  02/14/20 2025 - - - - - 100 % - -  02/14/20 2020 - - - - - 100 % - -  02/14/20 2016 138/71 - - 89 - 100 % - -  02/14/20 2010 - - - - - 100 % - -  02/14/20 2005 - - - - - 100 % - -  02/14/20 2001 139/79 - - 89 - - - -   02/14/20 2000 - - - - - 100 % - -  02/14/20 1955 - - - - - 100 % - -  02/14/20 1950 - - - - - 98 % - -  02/14/20 1949 (!) 147/98 - - (!) 102 - - - -  02/14/20 1920 (!) 141/86 98.3 F (36.8 C) Oral 100 17 100 % 5\' 4"  (1.626 m) 84 kg    Physical Exam Vitals and nursing note reviewed.  HENT:     Head: Normocephalic.  Cardiovascular:     Rate and Rhythm: Normal rate and regular rhythm.  Pulmonary:     Effort: Pulmonary effort is normal. No respiratory distress.     Breath sounds: Normal breath sounds. No wheezing.  Abdominal:     Tenderness: There is no abdominal tenderness. There is no guarding.  Musculoskeletal:     Right lower leg: No edema.     Left lower leg: No edema.  Skin:    General: Skin is warm and dry.  Neurological:     Mental Status: She is alert and oriented to person, place, and time.  Psychiatric:        Mood and Affect: Mood normal.        Behavior: Behavior normal.        Thought Content: Thought content normal.    MAU Course  Procedures  MDM Educated and discussed plan of care with Dr Vergie LivingPickens - recommends repeat PEC labs, giving patient HA cocktail and food to help with headache.   HA cocktail given and patient had allergic reaction to decadron.  Labs reviewed:  Results for orders placed or performed during the hospital encounter of 02/14/20 (  from the past 24 hour(s))  Protein / creatinine ratio, urine     Status: Abnormal   Collection Time: 02/14/20  7:32 PM  Result Value Ref Range   Creatinine, Urine 315.90 mg/dL   Total Protein, Urine 149 mg/dL   Protein Creatinine Ratio 0.47 (H) 0.00 - 0.15 mg/mg[Cre]  CBC     Status: Abnormal   Collection Time: 02/15/20 12:14 AM  Result Value Ref Range   WBC 7.7 4.0 - 10.5 K/uL   RBC 3.82 (L) 3.87 - 5.11 MIL/uL   Hemoglobin 10.2 (L) 12.0 - 15.0 g/dL   HCT 86.7 (L) 36 - 46 %   MCV 83.0 80.0 - 100.0 fL   MCH 26.7 26.0 - 34.0 pg   MCHC 32.2 30.0 - 36.0 g/dL   RDW 54.4 92.0 - 10.0 %   Platelets 262 150 -  400 K/uL   nRBC 0.0 0.0 - 0.2 %  Comprehensive metabolic panel     Status: Abnormal   Collection Time: 02/15/20 12:14 AM  Result Value Ref Range   Sodium 137 135 - 145 mmol/L   Potassium 4.0 3.5 - 5.1 mmol/L   Chloride 108 98 - 111 mmol/L   CO2 19 (L) 22 - 32 mmol/L   Glucose, Bld 88 70 - 99 mg/dL   BUN 11 6 - 20 mg/dL   Creatinine, Ser 7.12 0.44 - 1.00 mg/dL   Calcium 8.5 (L) 8.9 - 10.3 mg/dL   Total Protein 5.6 (L) 6.5 - 8.1 g/dL   Albumin 2.4 (L) 3.5 - 5.0 g/dL   AST 15 15 - 41 U/L   ALT 6 0 - 44 U/L   Alkaline Phosphatase 129 (H) 38 - 126 U/L   Total Bilirubin 0.4 0.3 - 1.2 mg/dL   GFR, Estimated >19 >75 mL/min   Anion gap 10 5 - 15   PEC labs stable  Reassessment of patient - patient reports HA is completely resolved after food, hydration and medication.   Patient reports that she is feeling better and would like to go home. Dr Vergie Living okay with discharge- patient has appointment scheduled in the office tomorrow. Strict PEC precautions reviewed with patient.  Discussed reasons to return to MAU. Follow up as scheduled in the office tomorrow. Return to MAU as needed. Pt stable at time of discharge.   Assessment and Plan   1. History of pre-eclampsia   2. Preeclampsia, third trimester   3. Supervision of high risk pregnancy, antepartum    Discharge home Follow up as scheduled in the office tomorrow Strict PEC precautions  Return to MAU as needed for reasons discussed and/or emergencies    Follow-up Information    [facility]. Go on 02/16/2020.   Specialty: Obstetrics and Gynecology Contact information: [address] [place] [address] [phone]             Allergies as of 02/15/2020      Reactions   Dexamethasone Anaphylaxis      Medication List    TAKE these medications   acetaminophen 650 MG CR tablet Commonly known as: TYLENOL Take 650 mg by mouth every 8 (eight) hours as  needed for pain.   butalbital-acetaminophen-caffeine 50-325-40 MG tablet Commonly known as: FIORICET Take 1-2 tablets by mouth every 6 (six) hours as needed for headache.   Classic Prenatal 28-0.8 MG Tabs Take 1 tablet by mouth daily.   famotidine 40 MG tablet Commonly known as: Pepcid Take 1  tablet (40 mg total) by mouth daily.   Magnesium 400 MG Tabs Take 1 tablet by mouth daily.       Sharyon Cable CNM 02/15/2020, 1:43 AM

## 2020-02-15 NOTE — MAU Note (Signed)
Was giving headache cocktail. Gave benadryl ordered dose. Started giving decadron and pt said her throat felt scratchy. Stopped giving. Started giving reglan and patient said she felt worse and her heart felt like it was pounding. Stopped reglan and continued LR. Gave pt 10L with non-rebreather. V/S WNL.

## 2020-02-16 ENCOUNTER — Other Ambulatory Visit: Payer: Self-pay

## 2020-02-16 ENCOUNTER — Encounter: Payer: Self-pay | Admitting: Obstetrics and Gynecology

## 2020-02-16 ENCOUNTER — Other Ambulatory Visit: Payer: Self-pay | Admitting: Obstetrics and Gynecology

## 2020-02-16 ENCOUNTER — Ambulatory Visit (INDEPENDENT_AMBULATORY_CARE_PROVIDER_SITE_OTHER): Payer: BC Managed Care – PPO | Admitting: Obstetrics and Gynecology

## 2020-02-16 VITALS — BP 156/90 | HR 75 | Wt 183.0 lb

## 2020-02-16 DIAGNOSIS — O1493 Unspecified pre-eclampsia, third trimester: Secondary | ICD-10-CM | POA: Diagnosis not present

## 2020-02-16 DIAGNOSIS — O0990 Supervision of high risk pregnancy, unspecified, unspecified trimester: Secondary | ICD-10-CM

## 2020-02-16 DIAGNOSIS — Z98891 History of uterine scar from previous surgery: Secondary | ICD-10-CM

## 2020-02-16 DIAGNOSIS — Z3A33 33 weeks gestation of pregnancy: Secondary | ICD-10-CM

## 2020-02-16 DIAGNOSIS — O09299 Supervision of pregnancy with other poor reproductive or obstetric history, unspecified trimester: Secondary | ICD-10-CM | POA: Diagnosis not present

## 2020-02-16 LAB — POCT URINALYSIS DIP (DEVICE)
Bilirubin Urine: NEGATIVE
Glucose, UA: NEGATIVE mg/dL
Hgb urine dipstick: NEGATIVE
Ketones, ur: NEGATIVE mg/dL
Leukocytes,Ua: NEGATIVE
Nitrite: NEGATIVE
Protein, ur: 100 mg/dL — AB
Specific Gravity, Urine: 1.02 (ref 1.005–1.030)
Urobilinogen, UA: 0.2 mg/dL (ref 0.0–1.0)
pH: 6.5 (ref 5.0–8.0)

## 2020-02-16 MED ORDER — BUTALBITAL-APAP-CAFFEINE 50-325-40 MG PO CAPS
1.0000 | ORAL_CAPSULE | Freq: Four times a day (QID) | ORAL | 0 refills | Status: DC | PRN
Start: 2020-02-16 — End: 2020-02-24

## 2020-02-16 NOTE — Progress Notes (Signed)
   PRENATAL VISIT NOTE  Subjective:  Meagan Lee is a 34 y.o. [id number] at [redacted]w[redacted]d being seen today for ongoing prenatal care.  She is currently monitored for the following issues for this high-risk pregnancy and has Prior c-section x 2 ; History of pre-eclampsia in prior pregnancy, currently pregnant; Infertility, female; Other fatigue; Vitamin D deficiency; New daily persistent headache; Supervision of high risk pregnancy, antepartum; History of pre-eclampsia; and Preeclampsia, third trimester on their problem list.  Patient reports headache. Got meds in MAU which helped but did it did not go away altogether. As bad as 8/10, after tylenol it is 5/10. Contractions: Irritability. Vag. Bleeding: None.  Movement: Present. Denies leaking of fluid.   The following portions of the patient's history were reviewed and updated as appropriate: allergies, current medications, past family history, past medical history, past social history, past surgical history and problem list.   Objective:   Vitals:   02/16/20 0957 02/16/20 1018  BP: (!) 153/99 (!) 156/90  Pulse: 75   Weight: 183 lb (83 kg)     Fetal Status: Fetal Heart Rate (bpm): 139   Movement: Present     General:  Alert, oriented and cooperative. Patient is in no acute distress.  Skin: Skin is warm and dry. No rash noted.   Cardiovascular: Normal heart rate noted  Respiratory: Normal respiratory effort, no problems with respiration noted  Abdomen: Soft, gravid, appropriate for gestational age.  Pain/Pressure: Absent     Pelvic: Cervical exam deferred        Extremities: Normal range of motion.  Edema: Trace  Mental Status: Normal mood and affect. Normal behavior. Normal judgment and thought content.   Assessment and Plan:  Pregnancy: [id number] at [redacted]w[redacted]d  1. Supervision of high risk pregnancy, antepartum  2. Preeclampsia, third trimester Headache improved with tylenol but did not go away altogether Gave fioricet to take as she was not  taking this Moved RCS to 37 weeks Reviewed s/s to go to hospital, reviewed may need early delivery if headache worsens  3. Prior c-section x 2  Will need repeat  4. History of pre-eclampsia in prior pregnancy, currently pregnant  5. [redacted] weeks gestation of pregnancy   Preterm labor symptoms and general obstetric precautions including but not limited to vaginal bleeding, contractions, leaking of fluid and fetal movement were reviewed in detail with the patient. Please refer to After Visit Summary for other counseling recommendations.   Return in about 1 week (around 02/23/2020) for in person, high OB.  Future Appointments  Date Time Provider Department Center  02/21/2020 10:30 AM [facility] [place] NURSE [facility]  02/21/2020 10:45 AM [facility]    Conan Bowens, MD

## 2020-02-16 NOTE — Progress Notes (Signed)
Patient reports continued headache since before MAU visit- reports minimal relief with tylenol- denies dizziness or blurry vision

## 2020-02-17 ENCOUNTER — Telehealth: Payer: Self-pay

## 2020-02-17 ENCOUNTER — Encounter (HOSPITAL_COMMUNITY): Payer: Self-pay | Admitting: Family Medicine

## 2020-02-17 ENCOUNTER — Inpatient Hospital Stay (HOSPITAL_COMMUNITY)
Admission: AD | Admit: 2020-02-17 | Discharge: 2020-02-22 | DRG: 788 | Disposition: A | Discharge status: Home / Self Care | Payer: BC Managed Care – PPO | Attending: Obstetrics and Gynecology | Admitting: Obstetrics and Gynecology

## 2020-02-17 ENCOUNTER — Other Ambulatory Visit: Payer: Self-pay

## 2020-02-17 DIAGNOSIS — O34219 Maternal care for unspecified type scar from previous cesarean delivery: Secondary | ICD-10-CM | POA: Diagnosis not present

## 2020-02-17 DIAGNOSIS — O34211 Maternal care for low transverse scar from previous cesarean delivery: Secondary | ICD-10-CM | POA: Diagnosis present

## 2020-02-17 DIAGNOSIS — O1493 Unspecified pre-eclampsia, third trimester: Secondary | ICD-10-CM | POA: Diagnosis present

## 2020-02-17 DIAGNOSIS — Z8759 Personal history of other complications of pregnancy, childbirth and the puerperium: Secondary | ICD-10-CM

## 2020-02-17 DIAGNOSIS — O09299 Supervision of pregnancy with other poor reproductive or obstetric history, unspecified trimester: Secondary | ICD-10-CM

## 2020-02-17 DIAGNOSIS — Z98891 History of uterine scar from previous surgery: Secondary | ICD-10-CM

## 2020-02-17 DIAGNOSIS — Z20822 Contact with and (suspected) exposure to covid-19: Secondary | ICD-10-CM | POA: Diagnosis present

## 2020-02-17 DIAGNOSIS — O1410 Severe pre-eclampsia, unspecified trimester: Secondary | ICD-10-CM

## 2020-02-17 DIAGNOSIS — O1413 Severe pre-eclampsia, third trimester: Secondary | ICD-10-CM | POA: Diagnosis not present

## 2020-02-17 DIAGNOSIS — O0990 Supervision of high risk pregnancy, unspecified, unspecified trimester: Secondary | ICD-10-CM

## 2020-02-17 DIAGNOSIS — Z3A33 33 weeks gestation of pregnancy: Secondary | ICD-10-CM

## 2020-02-17 DIAGNOSIS — O1414 Severe pre-eclampsia complicating childbirth: Principal | ICD-10-CM | POA: Diagnosis present

## 2020-02-17 DIAGNOSIS — R519 Headache, unspecified: Secondary | ICD-10-CM | POA: Diagnosis present

## 2020-02-17 DIAGNOSIS — O365930 Maternal care for other known or suspected poor fetal growth, third trimester, not applicable or unspecified: Secondary | ICD-10-CM | POA: Diagnosis not present

## 2020-02-17 DIAGNOSIS — O09293 Supervision of pregnancy with other poor reproductive or obstetric history, third trimester: Secondary | ICD-10-CM | POA: Diagnosis not present

## 2020-02-17 DIAGNOSIS — O99213 Obesity complicating pregnancy, third trimester: Secondary | ICD-10-CM | POA: Diagnosis not present

## 2020-02-17 HISTORY — DX: Severe pre-eclampsia, unspecified trimester: O14.10

## 2020-02-17 LAB — COMPREHENSIVE METABOLIC PANEL
ALT: 9 U/L (ref 0–44)
AST: 16 U/L (ref 15–41)
Albumin: 2.5 g/dL — ABNORMAL LOW (ref 3.5–5.0)
Alkaline Phosphatase: 130 U/L — ABNORMAL HIGH (ref 38–126)
Anion gap: 10 (ref 5–15)
BUN: 9 mg/dL (ref 6–20)
CO2: 17 mmol/L — ABNORMAL LOW (ref 22–32)
Calcium: 8.7 mg/dL — ABNORMAL LOW (ref 8.9–10.3)
Chloride: 107 mmol/L (ref 98–111)
Creatinine, Ser: 0.57 mg/dL (ref 0.44–1.00)
GFR, Estimated: >60 mL/min (ref >60)
Glucose, Bld: 100 mg/dL — ABNORMAL HIGH (ref 70–99)
Potassium: 3.7 mmol/L (ref 3.5–5.1)
Sodium: 134 mmol/L — ABNORMAL LOW (ref 135–145)
Total Bilirubin: 0.4 mg/dL (ref 0.3–1.2)
Total Protein: 5.7 g/dL — ABNORMAL LOW (ref 6.5–8.1)

## 2020-02-17 LAB — URINALYSIS, ROUTINE W REFLEX MICROSCOPIC
Bilirubin Urine: NEGATIVE
Glucose, UA: NEGATIVE mg/dL
Hgb urine dipstick: NEGATIVE
Ketones, ur: NEGATIVE mg/dL
Leukocytes,Ua: NEGATIVE
Nitrite: NEGATIVE
Protein, ur: >=300 mg/dL — AB
Specific Gravity, Urine: 1.019 (ref 1.005–1.030)
pH: 5 (ref 5.0–8.0)

## 2020-02-17 LAB — CBC
HCT: 31.5 % — ABNORMAL LOW (ref 36.0–46.0)
Hematocrit: 31.3 % — ABNORMAL LOW (ref 34.0–46.6)
Hemoglobin: 10.5 g/dL — ABNORMAL LOW (ref 11.1–15.9)
Hemoglobin: 10.1 g/dL — ABNORMAL LOW (ref 12.0–15.0)
MCH: 27.1 pg (ref 26.6–33.0)
MCH: 26 pg (ref 26.0–34.0)
MCHC: 33.5 g/dL (ref 31.5–35.7)
MCHC: 32.1 g/dL (ref 30.0–36.0)
MCV: 81 fL (ref 79–97)
MCV: 81 fL (ref 80.0–100.0)
Platelets: 268 x10E3/uL (ref 150–450)
Platelets: 270 K/uL (ref 150–400)
RBC: 3.88 x10E6/uL (ref 3.77–5.28)
RBC: 3.89 MIL/uL (ref 3.87–5.11)
RDW: 12.3 % (ref 11.7–15.4)
RDW: 12.5 % (ref 11.5–15.5)
WBC: 7.9 x10E3/uL (ref 3.4–10.8)
WBC: 8.3 K/uL (ref 4.0–10.5)
nRBC: 0.4 % — ABNORMAL HIGH (ref 0.0–0.2)

## 2020-02-17 LAB — COMPREHENSIVE METABOLIC PANEL WITH GFR
ALT: 5 IU/L (ref 0–32)
AST: 11 IU/L (ref 0–40)
Albumin/Globulin Ratio: 1.2 (ref 1.2–2.2)
Albumin: 3.4 g/dL — ABNORMAL LOW (ref 3.8–4.8)
Alkaline Phosphatase: 157 IU/L — ABNORMAL HIGH (ref 44–121)
BUN/Creatinine Ratio: 17 (ref 9–23)
BUN: 9 mg/dL (ref 6–20)
Bilirubin Total: <0.2 mg/dL (ref 0.0–1.2)
CO2: 15 mmol/L — ABNORMAL LOW (ref 20–29)
Calcium: 9 mg/dL (ref 8.7–10.2)
Chloride: 102 mmol/L (ref 96–106)
Creatinine, Ser: 0.53 mg/dL — ABNORMAL LOW (ref 0.57–1.00)
GFR calc Af Amer: 143 mL/min/1.73
GFR calc non Af Amer: 124 mL/min/1.73
Globulin, Total: 2.9 g/dL (ref 1.5–4.5)
Glucose: 90 mg/dL (ref 65–99)
Potassium: 4.2 mmol/L (ref 3.5–5.2)
Sodium: 135 mmol/L (ref 134–144)
Total Protein: 6.3 g/dL (ref 6.0–8.5)

## 2020-02-17 LAB — PROTEIN / CREATININE RATIO, URINE
Creatinine, Urine: 54.5 mg/dL
Creatinine, Urine: 173.78 mg/dL
Protein Creatinine Ratio: 1.98 mg/mg{creat} — ABNORMAL HIGH (ref 0.00–0.15)
Protein, Ur: 164.3 mg/dL
Protein/Creat Ratio: 3015 mg/g creat — ABNORMAL HIGH (ref 0–200)
Total Protein, Urine: 344 mg/dL

## 2020-02-17 MED ORDER — DOCUSATE SODIUM 100 MG PO CAPS
100.0000 mg | ORAL_CAPSULE | Freq: Every day | ORAL | Status: DC
  Administered 2020-02-20: 100 mg via ORAL
  Filled 2020-02-17 (×2): qty 1

## 2020-02-17 MED ORDER — PRENATAL MULTIVITAMIN CH
1.0000 | ORAL_TABLET | Freq: Every day | ORAL | Status: DC
  Administered 2020-02-18: 1 via ORAL
  Filled 2020-02-17: qty 1

## 2020-02-17 MED ORDER — CYCLOBENZAPRINE HCL 5 MG PO TABS
10.0000 mg | ORAL_TABLET | Freq: Once | ORAL | Status: AC
  Administered 2020-02-17: 10 mg via ORAL
  Filled 2020-02-17: qty 2

## 2020-02-17 MED ORDER — OXYCODONE-ACETAMINOPHEN 5-325 MG PO TABS
1.0000 | ORAL_TABLET | ORAL | Status: DC | PRN
  Administered 2020-02-18: 2 via ORAL
  Administered 2020-02-18 (×2): 1 via ORAL
  Administered 2020-02-19: 2 via ORAL
  Administered 2020-02-22: 1 via ORAL
  Filled 2020-02-17 (×3): qty 1
  Filled 2020-02-17 (×2): qty 2

## 2020-02-17 MED ORDER — CALCIUM CARBONATE ANTACID 500 MG PO CHEW
2.0000 | CHEWABLE_TABLET | ORAL | Status: DC | PRN
  Administered 2020-02-18: 400 mg via ORAL
  Filled 2020-02-17: qty 2

## 2020-02-17 MED ORDER — BUTALBITAL-APAP-CAFFEINE 50-325-40 MG PO TABS: 1.0000 | ORAL_TABLET | Freq: Once | ORAL | Status: DC

## 2020-02-17 MED ORDER — CYCLOBENZAPRINE HCL 10 MG PO TABS: 10.0000 mg | ORAL_TABLET | Freq: Three times a day (TID) | ORAL | Status: DC | PRN

## 2020-02-17 MED ORDER — ACETAMINOPHEN 325 MG PO TABS: 650.0000 mg | ORAL_TABLET | ORAL | Status: DC | PRN

## 2020-02-17 NOTE — MAU Provider Note (Addendum)
History     CSN: [id number]  Arrival date and time: 02/17/20 1759   First Provider Initiated Contact with Patient 02/17/20 2103      Chief Complaint  Patient presents with   Headache   Ms. Omni Dunsworth is a 34 y.o. [id number] at [redacted]w[redacted]d who presents to MAU for HA in setting of known preeclampsia. Patient reports she had difficulty getting her Fioricet filled at the pharmacy today and she could not treat herself at home. Patient reports she tried taking Tylenol for her headache once early this morning and once at [facility]. Patient reports her headache has been constant for one week. Patient reports lying down and taking Tylenol works for her, but as soon as she gets up to move around again, the headache starts to return. Patient denies history of migraine headaches or other types of headaches. Patient reports she took one extended release 650mg  Tylenol this morning and one again at [facility]. Patient reports the Tylenol had been helping, but reports it helped less today. Patient currently rates pain as 8/10.  Pt denies blurry vision/seeing spots, N/V, epigastric pain, swelling in face and hands, sudden weight gain. Pt denies chest pain and SOB.  Pt denies constipation, diarrhea, or urinary problems. Pt denies fever, chills, fatigue, sweating or changes in appetite. Pt denies dizziness, light-headedness, weakness.  Pt denies VB, ctx, LOF and reports good FM.  Current pregnancy problems? Hx C/S x2, PEC Blood Type? O Positive Allergies? dexamethasone Current medications? Tylenol PRN, PNV Current PNC & next appt? [facility], 02/21/2020   OB History    Gravida  4   Para  2   Term  2   Preterm      AB  1   Living  2     SAB  1   TAB      Ectopic      Multiple      Live Births  2           History reviewed. No pertinent past medical history.  Past Surgical History:  Procedure Laterality Date   CESAREAN SECTION     x2    Family History  Problem Relation Age of Onset    Hypertension Maternal Grandmother    Asthma Maternal Grandfather    Hypertension Paternal Aunt    Hypertension Paternal Uncle     Social History   Tobacco Use   Smoking status: Never Smoker   Smokeless tobacco: Never Used  Vaping Use   Vaping Use: Never used  Substance Use Topics   Alcohol use: No   Drug use: No    Allergies:  Allergies  Allergen Reactions   Dexamethasone Anaphylaxis    Medications Prior to Admission  Medication Sig Dispense Refill Last Dose   acetaminophen (TYLENOL) 650 MG CR tablet Take 650 mg by mouth every 8 (eight) hours as needed for pain.   02/17/2020 at 1500   Prenatal Vit-Fe Fumarate-FA (CLASSIC PRENATAL) 28-0.8 MG TABS Take 1 tablet by mouth daily.   02/16/2020 at Unknown time   Butalbital-APAP-Caffeine 50-325-40 MG capsule Take 1-2 capsules by mouth every 6 (six) hours as needed for headache. 30 capsule 0     Review of Systems  Constitutional: Negative for chills, diaphoresis, fatigue and fever.  Eyes: Negative for visual disturbance.  Respiratory: Negative for shortness of breath.   Cardiovascular: Negative for chest pain.  Gastrointestinal: Negative for abdominal pain, constipation, diarrhea, nausea and vomiting.  Genitourinary: Negative for dysuria, flank pain, frequency, pelvic pain, urgency, vaginal  bleeding and vaginal discharge.  Neurological: Positive for headaches. Negative for dizziness, weakness and light-headedness.   Physical Exam   Blood pressure (!) 141/96, pulse 71, temperature 98 F (36.7 C), temperature source Oral, resp. rate 20, weight 84.6 kg, last menstrual period 05/19/2019, SpO2 100 %, unknown if currently breastfeeding.  Patient Vitals for the past 24 hrs:  BP Temp Temp src Pulse Resp SpO2 Weight  02/17/20 2101 (!) 141/96 -- -- 71 -- -- --  02/17/20 2057 (!) 142/87 -- -- 75 -- -- --  02/17/20 1845 136/85 98 F (36.7 C) Oral 81 20 100 % --  02/17/20 1841 -- -- -- -- -- -- 84.6 kg   Physical Exam Vitals  and nursing note reviewed.  Constitutional:      General: She is not in acute distress.    Appearance: Normal appearance. She is normal weight. She is not ill-appearing, toxic-appearing or diaphoretic.  HENT:     Head: Normocephalic and atraumatic.  Pulmonary:     Effort: Pulmonary effort is normal.  Neurological:     Mental Status: She is alert and oriented to person, place, and time.  Psychiatric:        Mood and Affect: Mood normal.        Behavior: Behavior normal.        Thought Content: Thought content normal.        Judgment: Judgment normal.    Results for orders placed or performed during the hospital encounter of 02/17/20 (from the past 24 hour(s))  Urinalysis, Routine w reflex microscopic     Status: Abnormal   Collection Time: 02/17/20  7:08 PM  Result Value Ref Range   Color, Urine YELLOW YELLOW   APPearance CLOUDY (A) CLEAR   Specific Gravity, Urine 1.019 1.005 - 1.030   pH 5.0 5.0 - 8.0   Glucose, UA NEGATIVE NEGATIVE mg/dL   Hgb urine dipstick NEGATIVE NEGATIVE   Bilirubin Urine NEGATIVE NEGATIVE   Ketones, ur NEGATIVE NEGATIVE mg/dL   Protein, ur >=151 (A) NEGATIVE mg/dL   Nitrite NEGATIVE NEGATIVE   Leukocytes,Ua NEGATIVE NEGATIVE   RBC / HPF 0-5 0 - 5 RBC/hpf   WBC, UA 0-5 0 - 5 WBC/hpf   Bacteria, UA MANY (A) NONE SEEN   Squamous Epithelial / LPF 11-20 0 - 5   Mucus PRESENT    Hyaline Casts, UA PRESENT   Protein / creatinine ratio, urine     Status: Abnormal   Collection Time: 02/17/20  7:08 PM  Result Value Ref Range   Creatinine, Urine 173.78 mg/dL   Total Protein, Urine 344 mg/dL   Protein Creatinine Ratio 1.98 (H) 0.00 - 0.15 mg/mg[Cre]  CBC     Status: Abnormal   Collection Time: 02/17/20  7:24 PM  Result Value Ref Range   WBC 8.3 4.0 - 10.5 K/uL   RBC 3.89 3.87 - 5.11 MIL/uL   Hemoglobin 10.1 (L) 12.0 - 15.0 g/dL   HCT 76.1 (L) 36 - 46 %   MCV 81.0 80.0 - 100.0 fL   MCH 26.0 26.0 - 34.0 pg   MCHC 32.1 30.0 - 36.0 g/dL   RDW 60.7 37.1 -  06.2 %   Platelets 270 150 - 400 K/uL   nRBC 0.4 (H) 0.0 - 0.2 %  Comprehensive metabolic panel     Status: Abnormal   Collection Time: 02/17/20  7:24 PM  Result Value Ref Range   Sodium 134 (L) 135 - 145 mmol/L   Potassium 3.7 3.5 -  5.1 mmol/L   Chloride 107 98 - 111 mmol/L   CO2 17 (L) 22 - 32 mmol/L   Glucose, Bld 100 (H) 70 - 99 mg/dL   BUN 9 6 - 20 mg/dL   Creatinine, Ser 0.98 0.44 - 1.00 mg/dL   Calcium 8.7 (L) 8.9 - 10.3 mg/dL   Total Protein 5.7 (L) 6.5 - 8.1 g/dL   Albumin 2.5 (L) 3.5 - 5.0 g/dL   AST 16 15 - 41 U/L   ALT 9 0 - 44 U/L   Alkaline Phosphatase 130 (H) 38 - 126 U/L   Total Bilirubin 0.4 0.3 - 1.2 mg/dL   GFR, Estimated >11 >91 mL/min   Anion gap 10 5 - 15   [place] MFM OB FOLLOW UP  Result Date: 01/21/2020 ----------------------------------------------------------------------  OBSTETRICS REPORT                       (Signed Final 01/21/2020 12:47 pm) ---------------------------------------------------------------------- Patient Info  ID #:       [id number]                          D.O.B.:  May 18, 1985 (34 yrs)  Name:       Lezlie Octave                    Visit Date: 01/21/2020 11:39 am ---------------------------------------------------------------------- Performed By  Attending:        Noralee Space MD        Ref. Address:     [address]                                                             [place],                                                             [place] [address]  Performed By:     Reinaldo Raddle            [facility]                                                             [facility]  Referred By:      Armando Reichert CNM ---------------------------------------------------------------------- Orders  #  Description                           Code        Ordered By  1  [place]  MFM OB FOLLOW UP                   [phone]    YU FANG ----------------------------------------------------------------------  #  Order #                     Accession #                Episode #  1  [id number]                   [id number]                 [id number] ---------------------------------------------------------------------- Indications  [redacted] weeks gestation of pregnancy                Z3A.29  Obesity complicating pregnancy, second         O99.212  trimester (BMI 31)  Poor obstetric history: Previous               O09.299  preeclampsia / eclampsia/gestational HTN  History of cesarean delivery, currently        O35.219  pregnant  Encounter for antenatal screening for          Z36.3  malformations ---------------------------------------------------------------------- Fetal Evaluation  Num Of Fetuses:         1  Fetal Heart Rate(bpm):  157  Cardiac Activity:       Observed  Presentation:           Cephalic  Placenta:               Posterior  P. Cord Insertion:      Visualized, central  Amniotic Fluid  AFI FV:      Within normal limits  AFI Sum(cm)     %Tile       Largest Pocket(cm)  9.97            13          3.84  RUQ(cm)       RLQ(cm)       LUQ(cm)        LLQ(cm)  3.84          3.72          0              2.41 ---------------------------------------------------------------------- Biometry  BPD:      74.1  mm     G. Age:  29w 5d         38  %    CI:        79.92   %    70 - 86                                                          FL/HC:      20.8   %  19.2 - 21.4  HC:      261.9  mm     G. Age:  28w 3d        2.3  %    HC/AC:      1.08        0.99 - 1.21  AC:      243.2  mm     G. Age:  28w 4d         15  %    FL/BPD:     73.4   %    71 - 87  FL:       54.4  mm     G. Age:  28w 5d         13  %    FL/AC:      22.4   %    20 - 24  LV:        3.7  mm  Est. FW:    1271  gm    2 lb 13 oz      11  % ---------------------------------------------------------------------- OB History  Gravidity:    4         Term:    2         SAB:   1  Living:       2 ---------------------------------------------------------------------- Gestational Age  LMP:           35w 2d        Date:  05/19/19                 EDD:   02/23/20  U/S Today:     28w 6d                                        EDD:   04/08/20  Best:          29w 5d     Det. By:  U/S  (12/23/19)          EDD:   04/02/20 ---------------------------------------------------------------------- Anatomy  Cranium:               Previously seen        LVOT:                   Appears normal  Cavum:                 Previously seen        Aortic Arch:            Not well visualized  Ventricles:            Appears normal         Ductal Arch:            Not well visualized  Choroid Plexus:        Previously seen        Diaphragm:              Appears normal  Cerebellum:            Previously seen        Stomach:                Appears normal, left  sided  Posterior Fossa:       Previously seen        Abdomen:                Appears normal  Nuchal Fold:           Not applicable (>20    Abdominal Wall:         Previously seen                         wks GA)  Face:                  Orbits and profile     Cord Vessels:           Previously seen                         previously seen  Lips:                  Previously seen        Kidneys:                Appear normal  Palate:                Not well visualized    Bladder:                Appears normal  Thoracic:              Appears normal         Spine:                  Previously seen  Heart:                 Appears normal         Upper Extremities:      Previously seen                         (4CH, axis, and                         situs)  RVOT:                  Not well visualized    Lower Extremities:      Previously seen  Other:  Normal genaitalia. Nasal bone visualized. ---------------------------------------------------------------------- Cervix Uterus Adnexa  Cervix   Not visualized (advanced GA >24wks) ---------------------------------------------------------------------- Impression  Patient returned for completion of fetal anatomy .  Her  pregnancy is dated by midtrimester ultrasound performed at  our office.  She does not have gestational diabetes.  Fetal growth is appropriate for gestational age .the estimated  fetal weight is at the 11th percentile.  Amniotic fluid is normal  and good fetal activity is seen .  Cardiac anatomy could still  not be completed because of fetal position.  Placenta is  posterior and there is no evidence of previa or accreta.  Patient does not remember the birth weights of her previous  children.  Although there is no evidence of fetal growth restriction,  estimated fetal weight of 11th percentile requires a follow-up  growth assessment. ---------------------------------------------------------------------- Recommendations  -An appointment was made for her to return in 4 weeks for  fetal growth assessment. ----------------------------------------------------------------------                  Daleen Bo  Judeth Cornfield, MD Electronically Signed Final Report   01/21/2020 12:47 pm ----------------------------------------------------------------------   MAU Course  Procedures  MDM -preeclampsia evaluation without severe range BP in MAU on admission -symptoms include: HA; Flexeril given per consultation with Dr. Shawnie Pons, who recommends giving Flexeril as opposed to Fioricet d/t possible rebound headache; if Flexeril eliminates HA, patient can be discharged home with prescription for Flexeril, if not, will admit for severe preeclampsia -UA: cloudy/>/=300PRO/many bacteria -CBC: H/H 10.1/31.5, platelets 270 -CMP: serum creatinine 0.57, AST/ALT 16/9 -PCr: 1.98 -EFM: reactive       -baseline: 130       -variability: moderate/marked       -accels: present, 15x15       -decels: absent       -TOCO: irritability -report given and care turned over to M.  Mayford Knife, CNM Marylen Ponto, NP  9:50 PM 02/17/2020  -headache moderately improved from 8/10 to 6/10 with flexeril. Discussed case with Dr. Shawnie Pons and decision made to admit patient to antepartum for headache control and possible induction for pre-eclampsia at 34 weeks. Will order flexeril and percocet. Last u/s 01/21/20 EFW 11%ile, will order repeat u/s for tomorrow. preE labs unremarkable. Discussed with patient, patient amendable to plan and voiced understanding.  [name], MD OB Fellow, Faculty [facility], [facility] 02/17/2020 11:11 PM

## 2020-02-17 NOTE — Telephone Encounter (Signed)
-----   Message from Conan Bowens, MD sent at 02/17/2020  2:08 PM EST ----- Please call and let her know her urine has new protein in it, needs to go to MAU with worsening headache. Is it possible to get her in with MD on Monday rather than wait until Wednesday? Thanks.

## 2020-02-17 NOTE — Telephone Encounter (Addendum)
Called Pt using [employer] id# [phone], to advise of test results & what to do if she continues to have headaches & any other symptoms to go to MAU. Patient verbalized understanding.

## 2020-02-17 NOTE — H&P (Signed)
OBSTETRIC ADMISSION HISTORY AND PHYSICAL  Meagan Lee is a 34 y.o. female [phone] with IUP at [redacted]w[redacted]d by 25 wk u/s presenting for headache, please refer to MAU provider note for further details. She has a history of pre-eclampsia and headache unrelieved in the MAU with flexeril. She reports +FMs, No LOF, no VB, no blurry vision, or peripheral edema, and RUQ pain.  She plans on breast feeding. She request paragard for birth control. She received her prenatal care at [facility].  Dating: By 25 wk u/s --->  Estimated Date of Delivery: 04/02/20  Sono:    01/21/20@[redacted]w[redacted]d , CWD, normal anatomy, cephalic presentation, 1271g, 45% EFW   Prenatal History/Complications:  -prior cesarean section x2 (G1-preE, oligo, G2- failure to progress, preE) -anaphylaxis to dexamethasone (no BMZ) -pre-eclampsia with new headache   Past Medical History: History reviewed. No pertinent past medical history.  Past Surgical History: Past Surgical History:  Procedure Laterality Date  . CESAREAN SECTION     x2    Obstetrical History: OB History    Gravida  4   Para  2   Term  2   Preterm      AB  1   Living  2     SAB  1   TAB      Ectopic      Multiple      Live Births  2           Social History Social History   Socioeconomic History  . Marital status: Married    Spouse name: Not on file  . Number of children: Not on file  . Years of education: Not on file  . Highest education level: Not on file  Occupational History  . Not on file  Tobacco Use  . Smoking status: Never Smoker  . Smokeless tobacco: Never Used  Vaping Use  . Vaping Use: Never used  Substance and Sexual Activity  . Alcohol use: No  . Drug use: No  . Sexual activity: Yes    Birth control/protection: None  Other Topics Concern  . Not on file  Social History Narrative  . Not on file   Social Determinants of Health   Financial Resource Strain:   . Difficulty of Paying Living Expenses: Not on file  Food  Insecurity: No Food Insecurity  . Worried About Programme researcher, broadcasting/film/video in the Last Year: Never true  . Ran Out of Food in the Last Year: Never true  Transportation Needs: No Transportation Needs  . Lack of Transportation (Medical): No  . Lack of Transportation (Non-Medical): No  Physical Activity:   . Days of Exercise per Week: Not on file  . Minutes of Exercise per Session: Not on file  Stress:   . Feeling of Stress : Not on file  Social Connections: Socially Integrated  . Frequency of Communication with Friends and Family: More than three times a week  . Frequency of Social Gatherings with Friends and Family: More than three times a week  . Attends Religious Services: More than 4 times per year  . Active Member of Clubs or Organizations: Yes  . Attends Banker Meetings: More than 4 times per year  . Marital Status: Married    Family History: Family History  Problem Relation Age of Onset  . Hypertension Maternal Grandmother   . Asthma Maternal Grandfather   . Hypertension Paternal Aunt   . Hypertension Paternal Uncle     Allergies: Allergies  Allergen Reactions  .  Dexamethasone Anaphylaxis    Medications Prior to Admission  Medication Sig Dispense Refill Last Dose  . acetaminophen (TYLENOL) 650 MG CR tablet Take 650 mg by mouth every 8 (eight) hours as needed for pain.   02/17/2020 at 1500  . Prenatal Vit-Fe Fumarate-FA (CLASSIC PRENATAL) 28-0.8 MG TABS Take 1 tablet by mouth daily.   02/16/2020 at Unknown time  . Butalbital-APAP-Caffeine 50-325-40 MG capsule Take 1-2 capsules by mouth every 6 (six) hours as needed for headache. 30 capsule 0      Review of Systems   All systems reviewed and negative except as stated in HPI  Blood pressure 138/72, pulse 72, temperature 98 F (36.7 C), temperature source Oral, resp. rate 20, weight 84.6 kg, last menstrual period 05/19/2019, SpO2 100 %, unknown if currently breastfeeding. General appearance: alert, cooperative  and no distress Lungs: normal respiratory effort Heart: regular rate and rhythm Abdomen: soft, non-tender; gravid Pelvic: not indicated Extremities: Homans sign is negative, no sign of DVT Fetal monitoringBaseline: 140 bpm, Variability: Good {> 6 bpm), Accelerations: Reactive and Decelerations: Absent Uterine activity irritable     Prenatal labs: ABO, Rh: O/Positive/-- (09/22 1645) Antibody: Negative (09/22 1645) Rubella: 18.50 (09/22 1645) RPR: Non Reactive (11/08 0845)  HBsAg: Negative (09/22 1645)  HIV: Non Reactive (11/08 0845)  GBS:   not done yet given GA 2 hr Glucola passed Genetic screening  Low risk Anatomy US normal, f/u normal  Prenatal Transfer Tool  Maternal Diabetes: No Genetic Screening: Normal Maternal Ultrasounds/Referrals: Normal Fetal Ultrasounds or other Referrals:  Referred to Materal Fetal Medicine  Maternal Substance Abuse:  No Significant Maternal Medications:  None Significant Maternal Lab Results: None  Results for orders placed or performed during the hospital encounter of 02/17/20 (from the past 24 hour(s))  Urinalysis, Routine w reflex microscopic   Collection Time: 02/17/20  7:08 PM  Result Value Ref Range   Color, Urine YELLOW YELLOW   APPearance CLOUDY (A) CLEAR   Specific Gravity, Urine 1.019 1.005 - 1.030   pH 5.0 5.0 - 8.0   Glucose, UA NEGATIVE NEGATIVE mg/dL   Hgb urine dipstick NEGATIVE NEGATIVE   Bilirubin Urine NEGATIVE NEGATIVE   Ketones, ur NEGATIVE NEGATIVE mg/dL   Protein, ur >=616 (A) NEGATIVE mg/dL   Nitrite NEGATIVE NEGATIVE   Leukocytes,Ua NEGATIVE NEGATIVE   RBC / HPF 0-5 0 - 5 RBC/hpf   WBC, UA 0-5 0 - 5 WBC/hpf   Bacteria, UA MANY (A) NONE SEEN   Squamous Epithelial / LPF 11-20 0 - 5   Mucus PRESENT    Hyaline Casts, UA PRESENT   Protein / creatinine ratio, urine   Collection Time: 02/17/20  7:08 PM  Result Value Ref Range   Creatinine, Urine 173.78 mg/dL   Total Protein, Urine 344 mg/dL   Protein Creatinine  Ratio 1.98 (H) 0.00 - 0.15 mg/mg[Cre]  CBC   Collection Time: 02/17/20  7:24 PM  Result Value Ref Range   WBC 8.3 4.0 - 10.5 K/uL   RBC 3.89 3.87 - 5.11 MIL/uL   Hemoglobin 10.1 (L) 12.0 - 15.0 g/dL   HCT 07.3 (L) 36 - 46 %   MCV 81.0 80.0 - 100.0 fL   MCH 26.0 26.0 - 34.0 pg   MCHC 32.1 30.0 - 36.0 g/dL   RDW 71.0 62.6 - 94.8 %   Platelets 270 150 - 400 K/uL   nRBC 0.4 (H) 0.0 - 0.2 %  Comprehensive metabolic panel   Collection Time: 02/17/20  7:24 PM  Result Value Ref Range   Sodium 134 (L) 135 - 145 mmol/L   Potassium 3.7 3.5 - 5.1 mmol/L   Chloride 107 98 - 111 mmol/L   CO2 17 (L) 22 - 32 mmol/L   Glucose, Bld 100 (H) 70 - 99 mg/dL   BUN 9 6 - 20 mg/dL   Creatinine, Ser 3.47 0.44 - 1.00 mg/dL   Calcium 8.7 (L) 8.9 - 10.3 mg/dL   Total Protein 5.7 (L) 6.5 - 8.1 g/dL   Albumin 2.5 (L) 3.5 - 5.0 g/dL   AST 16 15 - 41 U/L   ALT 9 0 - 44 U/L   Alkaline Phosphatase 130 (H) 38 - 126 U/L   Total Bilirubin 0.4 0.3 - 1.2 mg/dL   GFR, Estimated >42 >59 mL/min   Anion gap 10 5 - 15    Patient Active Problem List   Diagnosis Date Noted  . Preeclampsia, severe 02/17/2020  . History of pre-eclampsia 02/14/2020  . Preeclampsia, third trimester 02/14/2020  . Supervision of high risk pregnancy, antepartum 12/08/2019  . Infertility, female 07/07/2019  . Other fatigue 07/07/2019  . Vitamin D deficiency 07/07/2019  . New daily persistent headache 07/07/2019  . Prior c-section x 2  01/11/2019  . History of pre-eclampsia in prior pregnancy, currently pregnant 01/11/2019    Assessment/Plan:  Meagan Lee is a 34 y.o. [id number] at [redacted]w[redacted]d here for headache refractory to flexeril with a prior diagnosis of preE at 33w. PreE labs unremarkable. Patient otherwise asymptomatic. Will plan to admit to antepartum for treatment of headache with flexeril and percocet. Given GA discussed BMZ with pharmacy, given prior anaphylactic reaction to dexamethasone, advised against BMZ at this time. Likely plan  for repeat cesarean section at 34w.  [name], MD  02/17/2020, 11:24 PM

## 2020-02-17 NOTE — MAU Note (Signed)
Reports with c/o H/A, reports took tylenol and no relief.  Denies epigastric pain & visual disturbances.  States instructed by MD to go to hospital secondary protein in urine.  Endorses +FM.

## 2020-02-18 ENCOUNTER — Inpatient Hospital Stay (HOSPITAL_BASED_OUTPATIENT_CLINIC_OR_DEPARTMENT_OTHER): Payer: BC Managed Care – PPO

## 2020-02-18 DIAGNOSIS — O34219 Maternal care for unspecified type scar from previous cesarean delivery: Secondary | ICD-10-CM | POA: Diagnosis not present

## 2020-02-18 DIAGNOSIS — Z3A33 33 weeks gestation of pregnancy: Secondary | ICD-10-CM

## 2020-02-18 DIAGNOSIS — O09293 Supervision of pregnancy with other poor reproductive or obstetric history, third trimester: Secondary | ICD-10-CM | POA: Diagnosis not present

## 2020-02-18 DIAGNOSIS — O1413 Severe pre-eclampsia, third trimester: Secondary | ICD-10-CM

## 2020-02-18 DIAGNOSIS — O99213 Obesity complicating pregnancy, third trimester: Secondary | ICD-10-CM | POA: Diagnosis not present

## 2020-02-18 DIAGNOSIS — O365930 Maternal care for other known or suspected poor fetal growth, third trimester, not applicable or unspecified: Secondary | ICD-10-CM

## 2020-02-18 DIAGNOSIS — E669 Obesity, unspecified: Secondary | ICD-10-CM

## 2020-02-18 LAB — RESP PANEL BY RT-PCR (FLU A&B, COVID) ARPGX2
Influenza A by PCR: NEGATIVE
Influenza B by PCR: NEGATIVE
SARS Coronavirus 2 by RT PCR: NEGATIVE

## 2020-02-18 MED ORDER — MAGNESIUM SULFATE BOLUS VIA INFUSION
4.0000 g | Freq: Once | INTRAVENOUS | Status: AC
  Administered 2020-02-18: 4 g via INTRAVENOUS
  Filled 2020-02-18: qty 1000

## 2020-02-18 MED ORDER — LABETALOL HCL 5 MG/ML IV SOLN: 80.0000 mg | INTRAVENOUS | Status: DC | PRN

## 2020-02-18 MED ORDER — LABETALOL HCL 5 MG/ML IV SOLN: 40.0000 mg | INTRAVENOUS | Status: DC | PRN

## 2020-02-18 MED ORDER — MAGNESIUM SULFATE 40 GM/1000ML IV SOLN
2.0000 g/h | INTRAVENOUS | Status: DC
  Administered 2020-02-18: 2 g/h via INTRAVENOUS
  Filled 2020-02-18: qty 1000

## 2020-02-18 MED ORDER — LABETALOL HCL 5 MG/ML IV SOLN: 20.0000 mg | INTRAVENOUS | Status: DC | PRN

## 2020-02-18 MED ORDER — LACTATED RINGERS IV SOLN: INTRAVENOUS | Status: DC

## 2020-02-18 MED ORDER — HYDRALAZINE HCL 20 MG/ML IJ SOLN: 10.0000 mg | INTRAMUSCULAR | Status: DC | PRN

## 2020-02-18 NOTE — Progress Notes (Signed)
Patient ID: Meagan Lee, female   DOB: December 06, 1985, 34 y.o.   MRN: [id number] FACULTY PRACTICE ANTEPARTUM(COMPREHENSIVE) NOTE  Adele Milson is a 34 y.o. [id number] at [redacted]w[redacted]d by early ultrasound who is admitted for preeclampsia with severe features.   Fetal presentation is cephalic. Length of Stay:  1  Days  Subjective: Patient still has headache and is tired Patient reports the fetal movement as active. Patient reports uterine contraction  activity as none. Patient reports  vaginal bleeding as none. Patient describes fluid per vagina as None.  Vitals:  Blood pressure (!) 152/75, pulse 78, temperature 98.6 F (37 C), temperature source Oral, resp. rate 18, height 5\' 4"  (1.626 m), weight 84.6 kg, last menstrual period 05/19/2019, SpO2 100 %, unknown if currently breastfeeding. Physical Examination:  General appearance - alert, well appearing, and in no distress Heart - normal rate and regular rhythm Abdomen - soft, nontender, nondistended Fundal Height:  size equals dates Cervical Exam: Not evaluated. Extremities: extremities normal, atraumatic, no cyanosis or edema and Homans sign is negative, no sign of DVT  Membranes:intact  Fetal Monitoring:   Fetal Heart Rate A  Mode External filed at 02/18/2020 1152  Baseline Rate (A) 140 bpm filed at 02/18/2020 1152  Variability 6-25 BPM filed at 02/18/2020 1152  Accelerations 15 x 15 filed at 02/18/2020 1152  Decelerations None filed at 02/18/2020 1152     Labs:  Results for orders placed or performed during the hospital encounter of 02/17/20 (from the past 24 hour(s))  Urinalysis, Routine w reflex microscopic   Collection Time: 02/17/20  7:08 PM  Result Value Ref Range   Color, Urine YELLOW YELLOW   APPearance CLOUDY (A) CLEAR   Specific Gravity, Urine 1.019 1.005 - 1.030   pH 5.0 5.0 - 8.0   Glucose, UA NEGATIVE NEGATIVE mg/dL   Hgb urine dipstick NEGATIVE NEGATIVE   Bilirubin Urine NEGATIVE NEGATIVE   Ketones, ur NEGATIVE NEGATIVE  mg/dL   Protein, ur 14/02/21 (A) NEGATIVE mg/dL   Nitrite NEGATIVE NEGATIVE   Leukocytes,Ua NEGATIVE NEGATIVE   RBC / HPF 0-5 0 - 5 RBC/hpf   WBC, UA 0-5 0 - 5 WBC/hpf   Bacteria, UA MANY (A) NONE SEEN   Squamous Epithelial / LPF 11-20 0 - 5   Mucus PRESENT    Hyaline Casts, UA PRESENT   Protein / creatinine ratio, urine   Collection Time: 02/17/20  7:08 PM  Result Value Ref Range   Creatinine, Urine 173.78 mg/dL   Total Protein, Urine 344 mg/dL   Protein Creatinine Ratio 1.98 (H) 0.00 - 0.15 mg/mg[Cre]  CBC   Collection Time: 02/17/20  7:24 PM  Result Value Ref Range   WBC 8.3 4.0 - 10.5 K/uL   RBC 3.89 3.87 - 5.11 MIL/uL   Hemoglobin 10.1 (L) 12.0 - 15.0 g/dL   HCT 14/02/21 (L) 36 - 46 %   MCV 81.0 80.0 - 100.0 fL   MCH 26.0 26.0 - 34.0 pg   MCHC 32.1 30.0 - 36.0 g/dL   RDW 90.2 40.9 - 73.5 %   Platelets 270 150 - 400 K/uL   nRBC 0.4 (H) 0.0 - 0.2 %  Comprehensive metabolic panel   Collection Time: 02/17/20  7:24 PM  Result Value Ref Range   Sodium 134 (L) 135 - 145 mmol/L   Potassium 3.7 3.5 - 5.1 mmol/L   Chloride 107 98 - 111 mmol/L   CO2 17 (L) 22 - 32 mmol/L   Glucose, Bld 100 (H) 70 - 99  mg/dL   BUN 9 6 - 20 mg/dL   Creatinine, Ser 1.60 0.44 - 1.00 mg/dL   Calcium 8.7 (L) 8.9 - 10.3 mg/dL   Total Protein 5.7 (L) 6.5 - 8.1 g/dL   Albumin 2.5 (L) 3.5 - 5.0 g/dL   AST 16 15 - 41 U/L   ALT 9 0 - 44 U/L   Alkaline Phosphatase 130 (H) 38 - 126 U/L   Total Bilirubin 0.4 0.3 - 1.2 mg/dL   GFR, Estimated >10 >93 mL/min   Anion gap 10 5 - 15  Resp Panel by RT-PCR (Flu A&B, Covid) Nasopharyngeal Swab   Collection Time: 02/17/20 11:36 PM   Specimen: Nasopharyngeal Swab; Nasopharyngeal(NP) swabs in vial transport medium  Result Value Ref Range   SARS Coronavirus 2 by RT PCR NEGATIVE NEGATIVE   Influenza A by PCR NEGATIVE NEGATIVE   Influenza B by PCR NEGATIVE NEGATIVE     Medications:  Scheduled . docusate sodium  100 mg Oral Daily  . prenatal multivitamin  1 tablet  Oral Q1200   I have reviewed the patient's current medications.  ASSESSMENT: Patient Active Problem List   Diagnosis Date Noted  . Preeclampsia, severe 02/17/2020  . History of pre-eclampsia 02/14/2020  . Preeclampsia, third trimester 02/14/2020  . Supervision of high risk pregnancy, antepartum 12/08/2019  . Infertility, female 07/07/2019  . Other fatigue 07/07/2019  . Vitamin D deficiency 07/07/2019  . New daily persistent headache 07/07/2019  . Prior c-section x 2  01/11/2019  . History of pre-eclampsia in prior pregnancy, currently pregnant 01/11/2019    PLAN: US showed EFW 11%ile, with nl cord dopplers. Dr. Parke Poisson recommends delivery, no need to delay for steroids as she has a sensitivity that prevents her from receiving BMZ  Scheryl Darter 02/18/2020,4:41 PM

## 2020-02-18 NOTE — Consult Note (Signed)
Asked by Dr.Davis to provide prenatal consultation for 34 y.o. G4 P2-0-1-2 mother who is now 33.[redacted] weeks EGA, with pregnancy complicated by pre-eclampsia. Repeat C/section is planned for tomorrow morning. She was not treated with betamethasone because of a previous reaction to Rx with dexamethasone.  Discussed with patient and FOB usual expectations for preterm infant at 46 - [redacted] weeks gestation, including possible needs for DR resuscitation, respiratory support, IV access. Explained family-centered care approach including parent participation in rounds, decision-making, and also presented usual criteria for discharge. Projected possible length of stay in NICU until 37 - [redacted] wks EGA.  Discussed advantages of feeding with mother's milk and discussed donor milk but patient and FOB declined use.  Patient and FOB were attentive, had appropriate questions.  Thank you for consulting Neonatology.  Total time 25, face-to-face time 15 minutes.  JWimmer, MD

## 2020-02-18 NOTE — Consult Note (Signed)
MFM Note  This patient has been hospitalized due to severe preeclampsia.  The overall EFW obtained today was at the 11th percentile for her gestational age (EFW 4 pounds 5 ounces).  There was normal amniotic fluid noted today.    Doppler studies of the umbilical arteries performed today shows continued normal forward flow.   There were no signs of absent or reversed end-diastolic flow noted today.  The fetus is in the vertex presentation.   Delivery should be considered should she continue to complain of a persistent headache.    As the patient reports that she has an anaphylactic reaction to steroids, delivery will not have to be delayed to administer a complete course of steroids.

## 2020-02-18 NOTE — Progress Notes (Signed)
Faculty Note  Reviewed case with Dr. Parke Poisson of MFM and Dr. Debroah Loop. Patient is 34 yo [id number] @ [redacted]w[redacted]d who presents with headache that is not improving on medication. Known pre-eclampsia. UPCr has increased significantly in last few days. Growth on baby is 11%tile.   Given that headache does not go away, although it does improve, with worsening proteinuria and unrelenting headache, consider severe pre-eclampsia. Patient ate just prior to admission tonight. With this in mind, plan for delivery via c-section in am (h/o 2 prior CS). Started on [employer].   Reviewed plan with patient and husband, they are agreeable to plan.  Reviewed risks of pre-eclampsia and feel that benefit of delivery outweighs risks of fetal prematurity at this point.  No antenatal corticosteroids as patient had dexamethasone in MAU last week and had shortness of breath and swelling in throat. NICU consult Mag started Am labs NPO after midnight  Case posted   K. Therese Sarah, M.D. Attending Center for [employer] [facility])

## 2020-02-19 ENCOUNTER — Encounter (HOSPITAL_COMMUNITY)
Admission: AD | Disposition: A | Discharge status: Home / Self Care | Payer: Self-pay | Source: Home / Self Care | Attending: Obstetrics and Gynecology

## 2020-02-19 ENCOUNTER — Inpatient Hospital Stay (HOSPITAL_COMMUNITY): Payer: BC Managed Care – PPO | Admitting: Anesthesiology

## 2020-02-19 ENCOUNTER — Encounter (HOSPITAL_COMMUNITY): Payer: Self-pay | Admitting: Family Medicine

## 2020-02-19 DIAGNOSIS — Z98891 History of uterine scar from previous surgery: Secondary | ICD-10-CM

## 2020-02-19 DIAGNOSIS — O1413 Severe pre-eclampsia, third trimester: Secondary | ICD-10-CM

## 2020-02-19 DIAGNOSIS — Z3A33 33 weeks gestation of pregnancy: Secondary | ICD-10-CM

## 2020-02-19 DIAGNOSIS — O34211 Maternal care for low transverse scar from previous cesarean delivery: Secondary | ICD-10-CM

## 2020-02-19 LAB — COMPREHENSIVE METABOLIC PANEL
ALT: 8 U/L (ref 0–44)
AST: 16 U/L (ref 15–41)
Albumin: 2.3 g/dL — ABNORMAL LOW (ref 3.5–5.0)
Alkaline Phosphatase: 133 U/L — ABNORMAL HIGH (ref 38–126)
Anion gap: 10 (ref 5–15)
BUN: 8 mg/dL (ref 6–20)
CO2: 19 mmol/L — ABNORMAL LOW (ref 22–32)
Calcium: 8.2 mg/dL — ABNORMAL LOW (ref 8.9–10.3)
Chloride: 106 mmol/L (ref 98–111)
Creatinine, Ser: 0.55 mg/dL (ref 0.44–1.00)
GFR, Estimated: >60 mL/min (ref >60)
Glucose, Bld: 85 mg/dL (ref 70–99)
Potassium: 4 mmol/L (ref 3.5–5.1)
Sodium: 135 mmol/L (ref 135–145)
Total Bilirubin: 0.3 mg/dL (ref 0.3–1.2)
Total Protein: 5.4 g/dL — ABNORMAL LOW (ref 6.5–8.1)

## 2020-02-19 LAB — CBC
HCT: 32 % — ABNORMAL LOW (ref 36.0–46.0)
HCT: 32 % — ABNORMAL LOW (ref 36.0–46.0)
Hemoglobin: 10.2 g/dL — ABNORMAL LOW (ref 12.0–15.0)
Hemoglobin: 10.8 g/dL — ABNORMAL LOW (ref 12.0–15.0)
MCH: 26.1 pg (ref 26.0–34.0)
MCH: 27 pg (ref 26.0–34.0)
MCHC: 31.9 g/dL (ref 30.0–36.0)
MCHC: 33.8 g/dL (ref 30.0–36.0)
MCV: 81.8 fL (ref 80.0–100.0)
MCV: 80 fL (ref 80.0–100.0)
Platelets: 276 10*3/uL (ref 150–400)
Platelets: 291 10*3/uL (ref 150–400)
RBC: 3.91 MIL/uL (ref 3.87–5.11)
RBC: 4 MIL/uL (ref 3.87–5.11)
RDW: 12.8 % (ref 11.5–15.5)
RDW: 12.9 % (ref 11.5–15.5)
WBC: 7.9 10*3/uL (ref 4.0–10.5)
WBC: 8 10*3/uL (ref 4.0–10.5)
nRBC: 0.3 % — ABNORMAL HIGH (ref 0.0–0.2)
nRBC: 0 % (ref 0.0–0.2)

## 2020-02-19 SURGERY — Surgical Case
Anesthesia: Spinal

## 2020-02-19 MED ORDER — COCONUT OIL OIL: 1.0000 "application " | TOPICAL_OIL | Status: DC | PRN

## 2020-02-19 MED ORDER — DIPHENHYDRAMINE HCL 25 MG PO CAPS: 25.0000 mg | ORAL_CAPSULE | ORAL | Status: DC | PRN

## 2020-02-19 MED ORDER — SENNOSIDES-DOCUSATE SODIUM 8.6-50 MG PO TABS: 2.0000 | ORAL_TABLET | Freq: Every evening | ORAL | Status: DC | PRN

## 2020-02-19 MED ORDER — SODIUM CHLORIDE 0.9% FLUSH: 3.0000 mL | INTRAVENOUS | Status: DC | PRN

## 2020-02-19 MED ORDER — OXYTOCIN-SODIUM CHLORIDE 30-0.9 UT/500ML-% IV SOLN
INTRAVENOUS | Status: AC
  Filled 2020-02-19: qty 500

## 2020-02-19 MED ORDER — SODIUM CHLORIDE 0.9 % IR SOLN
Status: DC | PRN
  Administered 2020-02-19: 1000 mL

## 2020-02-19 MED ORDER — SCOPOLAMINE 1 MG/3DAYS TD PT72
1.0000 | MEDICATED_PATCH | Freq: Once | TRANSDERMAL | Status: AC
  Administered 2020-02-19: 1.5 mg via TRANSDERMAL

## 2020-02-19 MED ORDER — KETOROLAC TROMETHAMINE 30 MG/ML IJ SOLN: 30.0000 mg | Freq: Four times a day (QID) | INTRAMUSCULAR | Status: AC | PRN

## 2020-02-19 MED ORDER — ONDANSETRON HCL 4 MG/2ML IJ SOLN
INTRAMUSCULAR | Status: DC | PRN
  Administered 2020-02-19: 4 mg via INTRAVENOUS

## 2020-02-19 MED ORDER — ENOXAPARIN SODIUM 40 MG/0.4ML ~~LOC~~ SOLN
40.0000 mg | SUBCUTANEOUS | Status: DC
  Administered 2020-02-20 – 2020-02-21 (×2): 40 mg via SUBCUTANEOUS
  Filled 2020-02-19 (×3): qty 0.4

## 2020-02-19 MED ORDER — PRENATAL MULTIVITAMIN CH
1.0000 | ORAL_TABLET | Freq: Every day | ORAL | Status: DC
  Administered 2020-02-20 – 2020-02-21 (×2): 1 via ORAL
  Filled 2020-02-19 (×2): qty 1

## 2020-02-19 MED ORDER — IBUPROFEN 800 MG PO TABS
800.0000 mg | ORAL_TABLET | Freq: Three times a day (TID) | ORAL | Status: DC
  Administered 2020-02-20 – 2020-02-22 (×7): 800 mg via ORAL
  Filled 2020-02-19 (×7): qty 1

## 2020-02-19 MED ORDER — ACETAMINOPHEN 500 MG PO TABS
1000.0000 mg | ORAL_TABLET | Freq: Four times a day (QID) | ORAL | Status: AC
  Administered 2020-02-19 – 2020-02-20 (×4): 1000 mg via ORAL
  Filled 2020-02-19 (×4): qty 2

## 2020-02-19 MED ORDER — FENTANYL CITRATE (PF) 100 MCG/2ML IJ SOLN
INTRAMUSCULAR | Status: DC | PRN
  Administered 2020-02-19: 15 ug via INTRATHECAL

## 2020-02-19 MED ORDER — POVIDONE-IODINE 10 % EX SWAB
2.0000 "application " | Freq: Once | CUTANEOUS | Status: DC
  Administered 2020-02-19: 2 via TOPICAL

## 2020-02-19 MED ORDER — OXYTOCIN-SODIUM CHLORIDE 30-0.9 UT/500ML-% IV SOLN: 2.5000 [IU]/h | INTRAVENOUS | Status: AC

## 2020-02-19 MED ORDER — FENTANYL CITRATE (PF) 100 MCG/2ML IJ SOLN
INTRAMUSCULAR | Status: AC
  Filled 2020-02-19: qty 2

## 2020-02-19 MED ORDER — ACETAMINOPHEN 325 MG PO TABS: 650.0000 mg | ORAL_TABLET | ORAL | Status: DC | PRN

## 2020-02-19 MED ORDER — STERILE WATER FOR IRRIGATION IR SOLN
Status: DC | PRN
  Administered 2020-02-19: 1

## 2020-02-19 MED ORDER — OXYTOCIN-SODIUM CHLORIDE 30-0.9 UT/500ML-% IV SOLN
INTRAVENOUS | Status: DC | PRN
  Administered 2020-02-19: 30 [IU] via INTRAVENOUS

## 2020-02-19 MED ORDER — CEFAZOLIN SODIUM-DEXTROSE 2-4 GM/100ML-% IV SOLN
INTRAVENOUS | Status: AC
  Filled 2020-02-19: qty 100

## 2020-02-19 MED ORDER — SIMETHICONE 80 MG PO CHEW
80.0000 mg | CHEWABLE_TABLET | Freq: Three times a day (TID) | ORAL | Status: DC
  Administered 2020-02-19 – 2020-02-21 (×7): 80 mg via ORAL
  Filled 2020-02-19 (×8): qty 1

## 2020-02-19 MED ORDER — ONDANSETRON HCL 4 MG/2ML IJ SOLN: 4.0000 mg | Freq: Three times a day (TID) | INTRAMUSCULAR | Status: DC | PRN

## 2020-02-19 MED ORDER — NALBUPHINE HCL 10 MG/ML IJ SOLN: 5.0000 mg | INTRAMUSCULAR | Status: DC | PRN

## 2020-02-19 MED ORDER — GABAPENTIN 100 MG PO CAPS
100.0000 mg | ORAL_CAPSULE | Freq: Two times a day (BID) | ORAL | Status: DC
  Administered 2020-02-19 – 2020-02-22 (×7): 100 mg via ORAL
  Filled 2020-02-19 (×7): qty 1

## 2020-02-19 MED ORDER — MEPERIDINE HCL 25 MG/ML IJ SOLN: 6.2500 mg | INTRAMUSCULAR | Status: DC | PRN

## 2020-02-19 MED ORDER — DIBUCAINE (PERIANAL) 1 % EX OINT: 1.0000 "application " | TOPICAL_OINTMENT | CUTANEOUS | Status: DC | PRN

## 2020-02-19 MED ORDER — NALBUPHINE HCL 10 MG/ML IJ SOLN: 5.0000 mg | Freq: Once | INTRAMUSCULAR | Status: DC | PRN

## 2020-02-19 MED ORDER — KETOROLAC TROMETHAMINE 30 MG/ML IJ SOLN
30.0000 mg | Freq: Four times a day (QID) | INTRAMUSCULAR | Status: AC | PRN
  Administered 2020-02-19: 30 mg via INTRAVENOUS
  Filled 2020-02-19: qty 1

## 2020-02-19 MED ORDER — WITCH HAZEL-GLYCERIN EX PADS: 1.0000 "application " | MEDICATED_PAD | CUTANEOUS | Status: DC | PRN

## 2020-02-19 MED ORDER — DIPHENHYDRAMINE HCL 25 MG PO CAPS: 25.0000 mg | ORAL_CAPSULE | Freq: Four times a day (QID) | ORAL | Status: DC | PRN

## 2020-02-19 MED ORDER — NALOXONE HCL 4 MG/10ML IJ SOLN
1.0000 ug/kg/h | INTRAVENOUS | Status: DC | PRN
  Filled 2020-02-19: qty 5

## 2020-02-19 MED ORDER — ONDANSETRON HCL 4 MG/2ML IJ SOLN
INTRAMUSCULAR | Status: AC
  Filled 2020-02-19: qty 2

## 2020-02-19 MED ORDER — OXYCODONE HCL 5 MG PO TABS
5.0000 mg | ORAL_TABLET | ORAL | Status: DC | PRN
  Administered 2020-02-20 – 2020-02-21 (×4): 5 mg via ORAL
  Filled 2020-02-19 (×5): qty 1

## 2020-02-19 MED ORDER — SOD CITRATE-CITRIC ACID 500-334 MG/5ML PO SOLN
ORAL | Status: AC
  Administered 2020-02-19: 30 mL
  Filled 2020-02-19: qty 15

## 2020-02-19 MED ORDER — SOD CITRATE-CITRIC ACID 500-334 MG/5ML PO SOLN: 30.0000 mL | Freq: Once | ORAL | Status: DC

## 2020-02-19 MED ORDER — TETANUS-DIPHTH-ACELL PERTUSSIS 5-2.5-18.5 LF-MCG/0.5 IM SUSY: 0.5000 mL | PREFILLED_SYRINGE | Freq: Once | INTRAMUSCULAR | Status: DC

## 2020-02-19 MED ORDER — FENTANYL CITRATE (PF) 100 MCG/2ML IJ SOLN
25.0000 ug | INTRAMUSCULAR | Status: DC | PRN
  Administered 2020-02-19: 50 ug via INTRAVENOUS

## 2020-02-19 MED ORDER — SCOPOLAMINE 1 MG/3DAYS TD PT72
MEDICATED_PATCH | TRANSDERMAL | Status: AC
  Filled 2020-02-19: qty 1

## 2020-02-19 MED ORDER — MORPHINE SULFATE (PF) 0.5 MG/ML IJ SOLN
INTRAMUSCULAR | Status: AC
  Filled 2020-02-19: qty 10

## 2020-02-19 MED ORDER — MORPHINE SULFATE (PF) 0.5 MG/ML IJ SOLN
INTRAMUSCULAR | Status: DC | PRN
  Administered 2020-02-19: 150 ug via INTRATHECAL

## 2020-02-19 MED ORDER — NALOXONE HCL 0.4 MG/ML IJ SOLN: 0.4000 mg | INTRAMUSCULAR | Status: DC | PRN

## 2020-02-19 MED ORDER — CEFAZOLIN SODIUM-DEXTROSE 2-3 GM-%(50ML) IV SOLR
INTRAVENOUS | Status: DC | PRN
  Administered 2020-02-19: 2 g via INTRAVENOUS

## 2020-02-19 MED ORDER — LACTATED RINGERS IV SOLN: INTRAVENOUS | Status: DC

## 2020-02-19 MED ORDER — MAGNESIUM SULFATE 40 GM/1000ML IV SOLN
2.0000 g/h | INTRAVENOUS | Status: DC
  Administered 2020-02-19: 2 g/h via INTRAVENOUS
  Filled 2020-02-19: qty 1000

## 2020-02-19 MED ORDER — BUPIVACAINE IN DEXTROSE 0.75-8.25 % IT SOLN
INTRATHECAL | Status: DC | PRN
  Administered 2020-02-19: 1.6 mL via INTRATHECAL

## 2020-02-19 MED ORDER — ACETAMINOPHEN 10 MG/ML IV SOLN
1000.0000 mg | Freq: Once | INTRAVENOUS | Status: AC
  Administered 2020-02-19: 1000 mg via INTRAVENOUS
  Filled 2020-02-19: qty 100

## 2020-02-19 MED ORDER — DIPHENHYDRAMINE HCL 50 MG/ML IJ SOLN
12.5000 mg | INTRAMUSCULAR | Status: DC | PRN
  Administered 2020-02-19: 12.5 mg via INTRAVENOUS
  Filled 2020-02-19: qty 1

## 2020-02-19 MED ORDER — ACETAMINOPHEN 325 MG PO TABS
650.0000 mg | ORAL_TABLET | ORAL | Status: DC | PRN
  Administered 2020-02-21: 650 mg via ORAL
  Filled 2020-02-19: qty 2

## 2020-02-19 MED ORDER — PHENYLEPHRINE HCL-NACL 20-0.9 MG/250ML-% IV SOLN
INTRAVENOUS | Status: DC | PRN
  Administered 2020-02-19: 40 ug/min via INTRAVENOUS

## 2020-02-19 MED ORDER — MENTHOL 3 MG MT LOZG: 1.0000 | LOZENGE | OROMUCOSAL | Status: DC | PRN

## 2020-02-19 MED ORDER — PHENYLEPHRINE HCL-NACL 20-0.9 MG/250ML-% IV SOLN
INTRAVENOUS | Status: AC
  Filled 2020-02-19: qty 250

## 2020-02-19 SURGICAL SUPPLY — 36 items
APL SKNCLS STERI-STRIP NONHPOA (GAUZE/BANDAGES/DRESSINGS) ×1
BENZOIN TINCTURE PRP APPL 2/3 (GAUZE/BANDAGES/DRESSINGS) ×2 IMPLANT
CANISTER SUCT 3000ML PPV (MISCELLANEOUS) ×2 IMPLANT
CHLORAPREP W/TINT 26ML (MISCELLANEOUS) ×2 IMPLANT
CLOSURE STERI STRIP 1/2 X4 (GAUZE/BANDAGES/DRESSINGS) ×2 IMPLANT
DRSG OPSITE POSTOP 4X10 (GAUZE/BANDAGES/DRESSINGS) ×2 IMPLANT
ELECT REM PT RETURN 9FT ADLT (ELECTROSURGICAL) ×2
ELECTRODE REM PT RTRN 9FT ADLT (ELECTROSURGICAL) ×1 IMPLANT
EXTRACTOR VACUUM KIWI (MISCELLANEOUS) ×2 IMPLANT
GLOVE BIOGEL PI IND STRL 7.0 (GLOVE) ×2 IMPLANT
GLOVE BIOGEL PI IND STRL 7.5 (GLOVE) ×1 IMPLANT
GLOVE BIOGEL PI INDICATOR 7.0 (GLOVE) ×2
GLOVE BIOGEL PI INDICATOR 7.5 (GLOVE) ×1
GLOVE SKINSENSE NS SZ7.0 (GLOVE) ×1
GLOVE SKINSENSE STRL SZ7.0 (GLOVE) ×1 IMPLANT
GOWN STRL REUS W/ TWL LRG LVL3 (GOWN DISPOSABLE) ×2 IMPLANT
GOWN STRL REUS W/ TWL XL LVL3 (GOWN DISPOSABLE) ×1 IMPLANT
GOWN STRL REUS W/TWL LRG LVL3 (GOWN DISPOSABLE) ×4
GOWN STRL REUS W/TWL XL LVL3 (GOWN DISPOSABLE) ×2
NS IRRIG 1000ML POUR BTL (IV SOLUTION) ×2 IMPLANT
PACK C SECTION WH (CUSTOM PROCEDURE TRAY) ×2 IMPLANT
PAD ABD 7.5X8 STRL (GAUZE/BANDAGES/DRESSINGS) ×2 IMPLANT
PAD OB MATERNITY 4.3X12.25 (PERSONAL CARE ITEMS) ×2 IMPLANT
PAD PREP 24X48 CUFFED NSTRL (MISCELLANEOUS) ×2 IMPLANT
PENCIL SMOKE EVAC W/HOLSTER (ELECTROSURGICAL) ×2 IMPLANT
STRIP CLOSURE SKIN 1/2X4 (GAUZE/BANDAGES/DRESSINGS) ×2 IMPLANT
SUT CHROMIC 2 0 CT 1 (SUTURE) ×2 IMPLANT
SUT MNCRL 0 VIOLET CTX 36 (SUTURE) ×2 IMPLANT
SUT MON AB 4-0 PS1 27 (SUTURE) ×2 IMPLANT
SUT MONOCRYL 0 CTX 36 (SUTURE) ×2
SUT PLAIN 2 0 XLH (SUTURE) ×2 IMPLANT
SUT VIC AB 0 CT1 36 (SUTURE) ×4 IMPLANT
SUT VIC AB 3-0 CT1 27 (SUTURE) ×2
SUT VIC AB 3-0 CT1 TAPERPNT 27 (SUTURE) ×1 IMPLANT
TOWEL OR 17X24 6PK STRL BLUE (TOWEL DISPOSABLE) ×4 IMPLANT
WATER STERILE IRR 1000ML POUR (IV SOLUTION) ×2 IMPLANT

## 2020-02-19 NOTE — Discharge Summary (Signed)
Postpartum Discharge Summary     Patient Name: Meagan Lee DOB: September 27, 1985 MRN: [id number]  Date of admission: 02/17/2020 Delivery date:02/19/2020  Delivering provider: Aletha Halim  Date of discharge: 02/22/2020  Admitting diagnosis: Preeclampsia, severe [O14.10] History of 2 cesarean sections [Z98.891] Intrauterine pregnancy: [redacted]w[redacted]d    Secondary diagnosis:  Active Problems:   Prior c-section x 2    History of pre-eclampsia in prior pregnancy, currently pregnant   Supervision of high risk pregnancy, antepartum   Preeclampsia, third trimester   Preeclampsia, severe   Cesarean delivery delivered   History of 2 cesarean sections      Discharge diagnosis: Preterm Pregnancy Delivered and Preeclampsia (severe)                                              Post partum procedures:none Augmentation: N/A Complications: None  Hospital course: Sceduled C/S   34y.o. yo [id number] admitted to the hospital 02/17/2020 for PreEclampsia with severe features based on headache and MR pressures, elevated urine P:C. She did not receive BMZ due to an anaphylactic reaction to decadron. She was scheduled for repeat cesarean section with the following indication:hx of 2 prior CS, PreE w SF.Delivery details are as follows:  Membrane Rupture Time/Date: 10:02 AM ,02/19/2020   Delivery Method:C-Section, Low Transverse  Details of operation can be found in separate operative note.  Patient had an uncomplicated postpartum course. She completed a 24 hour course of magnesium sulfate for seizure prophylaxis. Her blood pressures remained well controlled without any antihypertensives. She is ambulating, tolerating a regular diet, passing flatus, and urinating well. Patient is discharged home in stable condition on  02/22/20        Newborn Data: Birth date:02/19/2020  Birth time:10:02 AM  Gender:Female  Living status:Living  Apgars:8 ,9  Weight:1860 g     Magnesium Sulfate received: Yes: Seizure  prophylaxis BMZ received: No Rhophylac:N/A MMR:N/A T-DaP:Given prenatally Flu: No Transfusion:No  Physical exam  Vitals:   02/21/20 1140 02/21/20 1815 02/22/20 0515 02/22/20 0835  BP:  (!) 128/57 (!) 146/88 132/70  Pulse:  99 84 95  Resp:  '17 18 20  ' Temp:  98.1 F (36.7 C) 98.5 F (36.9 C) 98.1 F (36.7 C)  TempSrc:  Oral Oral Oral  SpO2: 95% 98% 100% 97%  Weight:      Height:       General: alert, cooperative and no distress Lochia: appropriate Uterine Fundus: firm Incision: Dressing is clean, dry, and intact DVT Evaluation: No evidence of DVT seen on physical exam. Labs: Lab Results  Component Value Date   WBC 8.2 02/20/2020   HGB 9.2 (L) 02/20/2020   HCT 28.3 (L) 02/20/2020   MCV 81.3 02/20/2020   PLT 250 02/20/2020   CMP Latest Ref Rng & Units 02/20/2020  Glucose 70 - 99 mg/dL 116(H)  BUN 6 - 20 mg/dL 9  Creatinine 0.44 - 1.00 mg/dL 0.76  Sodium 135 - 145 mmol/L 129(L)  Potassium 3.5 - 5.1 mmol/L 4.3  Chloride 98 - 111 mmol/L 100  CO2 22 - 32 mmol/L 18(L)  Calcium 8.9 - 10.3 mg/dL 7.0(L)  Total Protein 6.5 - 8.1 g/dL 4.8(L)  Total Bilirubin 0.3 - 1.2 mg/dL 0.5  Alkaline Phos 38 - 126 U/L 116  AST 15 - 41 U/L 21  ALT 0 - 44 U/L 9  Edinburgh Score: Edinburgh Postnatal Depression Scale Screening Tool 02/20/2020  I have been able to laugh and see the funny side of things. 0  I have looked forward with enjoyment to things. 0  I have blamed myself unnecessarily when things went wrong. 0  I have been anxious or worried for no good reason. 0  I have felt scared or panicky for no good reason. 0  Things have been getting on top of me. 0  I have been so unhappy that I have had difficulty sleeping. 0  I have felt sad or miserable. 0  I have been so unhappy that I have been crying. 0  The thought of harming myself has occurred to me. 0  Edinburgh Postnatal Depression Scale Total 0     After visit meds:  Allergies as of 02/22/2020      Reactions    Dexamethasone Anaphylaxis      Medication List    TAKE these medications   acetaminophen 650 MG CR tablet Commonly known as: TYLENOL Take 650 mg by mouth every 8 (eight) hours as needed for pain.   Butalbital-APAP-Caffeine 50-325-40 MG capsule Take 1-2 capsules by mouth every 6 (six) hours as needed for headache.   Classic Prenatal 28-0.8 MG Tabs Take 1 tablet by mouth daily.   ibuprofen 800 MG tablet Commonly known as: ADVIL Take 1 tablet (800 mg total) by mouth every 8 (eight) hours.   oxyCODONE-acetaminophen 5-325 MG tablet Commonly known as: PERCOCET/ROXICET Take 1 tablet by mouth every 4 (four) hours as needed (headache).        Discharge home in stable condition Infant Feeding: Breast Infant Disposition:NICU Discharge instruction: per After Visit Summary and Postpartum booklet. Activity: Advance as tolerated. Pelvic rest for 6 weeks.  Diet: routine diet Future Appointments: Future Appointments  Date Time Provider [place]  02/28/2020  9:20 AM [facility] NURSE [facility]  04/03/2020  9:15 AM Donnamae Jude, MD [facility]   Follow up Visit:  Tome for Mariano Colon at [facility] Follow up.   Specialty: Obstetrics and Gynecology Why: As scheduled for incision check and postpartum visit Contact information: [address] [place] [address] [phone]               Please schedule this patient for a In person postpartum visit in 6 weeks with the following provider: MD. Additional Postpartum F/U:Incision check 1 week and BP check 1 week  High risk pregnancy complicated by: repeat CS, hx of PEC w SF  Delivery mode:  C-Section, Low Transverse  Anticipated Birth Control:  IUD desires paragard as outpatient    02/22/2020 Mora Bellman, MD

## 2020-02-19 NOTE — Lactation Note (Signed)
This note was copied from a baby's chart. Lactation Consultation Note  Patient Name: Meagan Lee TIWPY'[id number] Date: 02/19/2020 Reason for consult: Initial assessment;Infant < 6lbs;Late-preterm 34-36.6wks;Preterm <34wks P3, 12 hour pre-term female infant in NICU. LC used interpreter services mom  decline interpreter. Mom is experienced at breastfeeding she breastfeed 1st child for 2 years and 2nd child who is 34 years old for 1 year 3 months. Mom is not on the [facility] program nor does she have DEBP at home. LC discussed hand expression and mom taught back. Mom not use the DEBP yet, LC discussed the importance of pumping to help stimulate and establish mom's milk supply due infant separation and infant beng in NICU.  Mom understands to pump every 3 hours for 15 minutes on initial setting. Mom used DEBP while LC was in the room and expressed 6 mls of colostrum that dad is taking to NICU with labels. Mom shown how to use DEBP & how to disassemble, clean, & reassemble parts. Mom knows to follow NICU infant feeding guidelines. Mom knows to call [facility] services if she has questions or concerns regarding breastfeeding. LC suggested mom attend the [place] [facility]feeding Support group within the local community ( free) after hospital discharge. Mom made aware of O/P services, breastfeeding support groups, community resources, and our phone # for post-discharge questions.  Maternal Data Formula Feeding for Exclusion: No Has patient been taught Hand Expression?: Yes Does the patient have breastfeeding experience prior to this delivery?: Yes  Feeding Feeding Type: Formula  LATCH Score                   Interventions Interventions: Breast feeding basics reviewed;Hand express;Expressed milk;DEBP;Breast massage  Lactation Tools Discussed/Used WIC Program: No Pump Review: Setup, frequency, and cleaning;Milk Storage Initiated by:: Danelle Earthly, IBCLC Date initiated:: 02/19/20   Consult  Status Consult Status: Follow-up Date: 02/20/20 Follow-up type: In-patient    Danelle Earthly 02/19/2020, 10:18 PM

## 2020-02-19 NOTE — Anesthesia Postprocedure Evaluation (Signed)
Anesthesia Post Note  Patient: Meagan Lee  Procedure(s) Performed: CESAREAN SECTION (N/A )     Patient location during evaluation: PACU Anesthesia Type: Spinal Level of consciousness: awake and alert Pain management: pain level controlled Vital Signs Assessment: post-procedure vital signs reviewed and stable Respiratory status: spontaneous breathing and respiratory function stable Cardiovascular status: blood pressure returned to baseline and stable Postop Assessment: spinal receding Anesthetic complications: no   No complications documented.  Last Vitals:  Vitals:   02/19/20 1405 02/19/20 1410  BP:    Pulse:    Resp:    Temp:    SpO2: 97% 97%    Last Pain:  Vitals:   02/19/20 1331  TempSrc: Oral  PainSc:    Pain Goal: Patients Stated Pain Goal: 3 (02/19/20 1330)              Epidural/Spinal Function Cutaneous sensation: Normal sensation (02/19/20 1330)  Kennieth Rad

## 2020-02-19 NOTE — Op Note (Addendum)
Operative Note   SURGERY DATE: 02/19/2020  PRE-OP DIAGNOSIS:  *Pregnancy at 33/6 *Severe pre-eclampsia (mild range BPs, unremitting headache) *History of cesarean x 2 *Desire for repeat cesarean section  POST-OP DIAGNOSIS: Same. delivered   PROCEDURE: Repeat low transverse cesarean section via pfannenstiel skin incision with double layer uterine closure  SURGEON: Surgeon(s) and Role:    * Griggs Bing, MD - Primary  ASSISTANT: Casper Harrison, MD (OB Fellow)  ANESTHESIA: spinal  ESTIMATED BLOOD LOSS:  DRAINS: UOP via indwelling foley  TOTAL IV FLUIDS: crystalloid  VTE PROPHYLAXIS: SCDs to bilateral lower extremities  ANTIBIOTICS: Two grams of Cefazolin were given., within 1 hour of skin incision  SPECIMENS: placenta and AV cord gases  COMPLICATIONS: None  FINDINGS: Of note, there were no intra-abdominal adhesions. Grossly normal uterus, tubes and ovaries. Clear amniotic fluid, cephalic, female infant, weight 1860gm, APGARs 8/9, intact placenta. Results for LUS, KRIEGEL (MRN [id number]) as of 02/19/2020 23:50  Ref. Range 02/19/2020 10:18 02/19/2020 10:23  pH cord blood (arterial) Latest Ref Range: 7.210 - 7.380  7.294   pCO2 cord blood (arterial) Latest Ref Range: 42.0 - 56.0 mmHg 47.0   Bicarbonate Latest Ref Range: 13.0 - 22.0 mmol/L 22.1 (H) 21.4  Ph Cord Blood (Venous) Latest Ref Range: 7.240 - 7.380   7.339  pCO2 Cord Blood (Venous) Latest Ref Range: 42.0 - 56.0   40.8 (L)    PROCEDURE IN DETAIL: The patient was taken to the operating room where anesthesia was administered and normal fetal heart tones were confirmed. She was then prepped and draped in the normal fashion in the dorsal supine position with a leftward tilt.  After a time out was performed, a pfannensteil  skin incision was made with the scalpel and carried through to the underlying layer of fascia. The fascia was then incised at the midline and this incision was extended laterally  with the mayo scissors. Attention was turned to the superior aspect of the fascial incision which was grasped with the kocher clamps x 2, tented up and the rectus muscles were dissected off with the scalpel. In a similar fashion the inferior aspect of the fascial incision was grasped with the kocher clamps, tented up and the rectus muscles dissected off with the mayo scissors. The rectus muscles were then separated in the midline and the peritoneum was entered bluntly. The bladder blade was inserted and the vesicouterine peritoneum was identified, tented up and entered with the metzenbaum scissors. This incision was extended laterally and the bladder flap was created digitally. The bladder blade was reinserted.  A low transverse hysterotomy was made with the scalpel until the endometrial cavity was breached and the amniotic sac ruptured with the Allis clamp, yielding clear amniotic fluid. This incision was extended bluntly and the infant's head, shoulders and body were delivered atraumatically.The cord was clamped x 2 and cut, and the infant was handed to the awaiting pediatricians, after delayed cord clamping was done.  The placenta was then gradually expressed from the uterus and then the uterus was exteriorized and cleared of all clots and debris. The hysterotomy was repaired with a running suture of 1-0 monocryl. A second imbricating layer of 1-0 monocryl suture was added to achieve excellent hemostasis.    The uterus and adnexa were then returned to the abdomen, and the hysterotomy and all operative sites were reinspected and excellent hemostasis was noted after irrigation and suction of the abdomen with warm saline.  The peritoneum was closed with a  running stitch of 3-0 Vicryl. The fascia was reapproximated with 0 Vicryl in a simple running fashion bilaterally. The subcutaneous layer was then reapproximated with interrupted sutures of 2-0 plain gut, and the skin was then closed with 4-0 monocryl, in a  subcuticular fashion.  The patient  tolerated the procedure well. Sponge, lap, needle, and instrument counts were correct x 2. The patient was transferred to the recovery room awake, alert and breathing independently in stable condition.  Patient stated that her headache had almost completely resolved by the end of the surgery  Cornelia Copa MD Attending Center for [facility])

## 2020-02-19 NOTE — Anesthesia Preprocedure Evaluation (Signed)
Anesthesia Evaluation  Patient identified by MRN, date of birth, ID band Patient awake    Reviewed: Allergy & Precautions, NPO status , Patient's Chart, lab work & pertinent test results  Airway Mallampati: II  TM Distance: >3 FB     Dental   Pulmonary neg pulmonary ROS,    breath sounds clear to auscultation       Cardiovascular hypertension,  Rhythm:Regular Rate:Normal     Neuro/Psych  Headaches,    GI/Hepatic negative GI ROS, Neg liver ROS,   Endo/Other  negative endocrine ROS  Renal/GU negative Renal ROS     Musculoskeletal   Abdominal   Peds  Hematology  (+) anemia ,   Anesthesia Other Findings   Reproductive/Obstetrics (+) Pregnancy                             Lab Results  Component Value Date   WBC 7.9 02/19/2020   HGB 10.2 (L) 02/19/2020   HCT 32.0 (L) 02/19/2020   MCV 81.8 02/19/2020   PLT 276 02/19/2020   Lab Results  Component Value Date   CREATININE 0.55 02/19/2020   BUN 8 02/19/2020   NA 135 02/19/2020   K 4.0 02/19/2020   CL 106 02/19/2020   CO2 19 (L) 02/19/2020    Anesthesia Physical Anesthesia Plan  ASA: III  Anesthesia Plan: Spinal   Post-op Pain Management:    Induction:   PONV Risk Score and Plan: 2 and Treatment may vary due to age or medical condition, Ondansetron and Scopolamine patch - Pre-op  Airway Management Planned: Natural Airway  Additional Equipment: None  Intra-op Plan:   Post-operative Plan:   Informed Consent: I have reviewed the patients History and Physical, chart, labs and discussed the procedure including the risks, benefits and alternatives for the proposed anesthesia with the patient or authorized representative who has indicated his/her understanding and acceptance.       Plan Discussed with: CRNA  Anesthesia Plan Comments:         Anesthesia Quick Evaluation

## 2020-02-19 NOTE — Transfer of Care (Signed)
Immediate Anesthesia Transfer of Care Note  Patient: Lezlie Octave  Procedure(s) Performed: CESAREAN SECTION (N/A )  Patient Location: PACU  Anesthesia Type:Spinal  Level of Consciousness: awake, alert  and oriented  Airway & Oxygen Therapy: Patient Spontanous Breathing  Post-op Assessment: Report given to RN and Post -op Vital signs reviewed and stable  Post vital signs: Reviewed and stable  Last Vitals:  Vitals Value Taken Time  BP 129/74 02/19/20 1052  Temp    Pulse 77 02/19/20 1056  Resp 16 02/19/20 1056  SpO2 99 % 02/19/20 1056  Vitals shown include unvalidated device data.  Last Pain:  Vitals:   02/19/20 0911  TempSrc: Oral  PainSc:       Patients Stated Pain Goal: 0 (02/18/20 1701)  Complications: No complications documented.

## 2020-02-19 NOTE — Progress Notes (Signed)
L&D Note  02/19/2020 - 8:39 AM  34 y.o. [id number] [redacted]w[redacted]d. Pregnancy complicated by h/o c-section, h/o HA  Patient Active Problem List   Diagnosis Date Noted  . Preeclampsia, severe 02/17/2020  . History of pre-eclampsia 02/14/2020  . Preeclampsia, third trimester 02/14/2020  . Supervision of high risk pregnancy, antepartum 12/08/2019  . Infertility, female 07/07/2019  . Other fatigue 07/07/2019  . Vitamin D deficiency 07/07/2019  . New daily persistent headache 07/07/2019  . Prior c-section x 2  01/11/2019  . History of pre-eclampsia in prior pregnancy, currently pregnant 01/11/2019    [name] is admitted for concern for severe pre-eclampsia   Subjective:  No labor s/s or decreased FM. Continues with HA  Objective:   Vitals:   02/19/20 0400 02/19/20 0530 02/19/20 0600 02/19/20 0809  BP: 135/72   (!) 148/87  Pulse: 87   94  Resp: 19 16 18 18   Temp: 97.8 F (36.6 C)   98 F (36.7 C)  TempSrc: Oral   Oral  SpO2: 98%  97% 99%  Weight:      Height:        Current Vital Signs 24h Vital Sign Ranges  T 98 F (36.7 C) Temp  Avg: 98.2 F (36.8 C)  Min: 97.8 F (36.6 C)  Max: 98.8 F (37.1 C)  BP (!) 148/87 BP  Min: 132/82  Max: 152/93  HR 94 Pulse  Avg: 84.9  Min: 77  Max: 94  RR 18 Resp  Avg: 17.1  Min: 15  Max: 19  SaO2 99 % Room Air SpO2  Avg: 99.1 %  Min: 97 %  Max: 100 %       24 Hour I/O Current Shift I/O  Time Ins Outs 12/03 0701 - 12/04 0700 In: 1423.2 [P.O.:600; I.V.:823.2] Out: 1850 [Urine:1850] No intake/output data recorded.   FHR: pt about to be taken to OR 125 baseline, +accels, no decel, mod variability Toco no UCs Gen: NAD  Labs:  Recent Labs  Lab 02/16/20 1036 02/17/20 1924 02/19/20 0334  WBC 7.9 8.3 7.9  HGB 10.5* 10.1* 10.2*  HCT 31.3* 31.5* 32.0*  PLT 268 270 276   Recent Labs  Lab 02/16/20 1036 02/17/20 1924 02/19/20 0334  NA 135 134* 135  K 4.2 3.7 4.0  CL 102 107 106  CO2 15* 17* 19*  BUN 9 9 8   CREATININE 0.53* 0.57  0.55  CALCIUM 9.0 8.7* 8.2*  PROT 6.3 5.7* 5.4*  BILITOT <0.2 0.4 0.3  ALKPHOS 157* 130* 133*  ALT 5 9 8   AST 11 16 16   GLUCOSE 90 100* 85   O pos  Medications Current Facility-Administered Medications  Medication Dose Route Frequency Provider Last Rate Last Admin  . acetaminophen (TYLENOL) tablet 650 mg  650 mg Oral Q4H PRN 14/04/21, MD      . calcium carbonate (TUMS - dosed in mg elemental calcium) chewable tablet 400 mg of elemental calcium  2 tablet Oral Q4H PRN , MD   400 mg of elemental calcium at 02/18/20 1830  . cyclobenzaprine (FLEXERIL) tablet 10 mg  10 mg Oral TID PRN , MD      . docusate sodium (COLACE) capsule 100 mg  100 mg Oral Daily [name], MD      . labetalol (NORMODYNE) injection 20 mg  20 mg Intravenous PRN [name], MD       And  . labetalol (NORMODYNE) injection 40 mg  40 mg Intravenous PRN Conan Bowens, MD       And  . labetalol (NORMODYNE) injection 80 mg  80 mg Intravenous PRN Conan Bowens, MD       And  . hydrALAZINE (APRESOLINE) injection 10 mg  10 mg Intravenous PRN Conan Bowens, MD      . lactated ringers infusion   Intravenous Continuous Conan Bowens, MD 75 mL/hr at 02/19/20 0450 New Bag at 02/19/20 0450  . magnesium sulfate 40 grams in SWI 1000 mL OB infusion  2 g/hr Intravenous Continuous Conan Bowens, MD 50 mL/hr at 02/18/20 2025 2 g/hr at 02/18/20 2025  . oxyCODONE-acetaminophen (PERCOCET/ROXICET) 5-325 MG per tablet 1-2 tablet  1-2 tablet Oral Q4H PRN [name], MD   2 tablet at 02/19/20 0031  . povidone-iodine 10 % swab 2 application  2 application Topical Once Wouk, Wilfred Curtis, MD      . prenatal multivitamin tablet 1 tablet  1 tablet Oral Q1200 [name], MD   1 tablet at 02/18/20 1114    Assessment & Plan:  Pt stable *IUP: category I *Severe pre-eclampsia: labs stable, currently on Mg. With continued HA, given this, per MFM recs will move towards  delivery today with rpt c-section this morning. I d/w her re: BTL. She doesn't want anymore kids but does not want a BTL.  *Preterm: s/p nicu consult. Had steroids in the past for HA and had allergic type rxn. Unable to give BMZ  Cornelia Copa MD Attending Center for [facility])

## 2020-02-19 NOTE — Anesthesia Procedure Notes (Signed)
Spinal  Patient location during procedure: OR Start time: 02/19/2020 9:34 AM End time: 02/19/2020 9:39 AM Staffing Performed: anesthesiologist  Anesthesiologist: Marcene Duos, MD Preanesthetic Checklist Completed: patient identified, IV checked, site marked, risks and benefits discussed, surgical consent, monitors and equipment checked, pre-op evaluation and timeout performed Spinal Block Patient position: sitting Prep: DuraPrep Patient monitoring: heart rate, cardiac monitor, continuous pulse ox and blood pressure Approach: midline Location: L4-5 Injection technique: single-shot Needle Needle type: Pencan  Needle gauge: 24 G Needle length: 9 cm Assessment Sensory level: T4

## 2020-02-20 LAB — COMPREHENSIVE METABOLIC PANEL
ALT: 9 U/L (ref 0–44)
AST: 21 U/L (ref 15–41)
Albumin: 2.1 g/dL — ABNORMAL LOW (ref 3.5–5.0)
Alkaline Phosphatase: 116 U/L (ref 38–126)
Anion gap: 11 (ref 5–15)
BUN: 9 mg/dL (ref 6–20)
CO2: 18 mmol/L — ABNORMAL LOW (ref 22–32)
Calcium: 7 mg/dL — ABNORMAL LOW (ref 8.9–10.3)
Chloride: 100 mmol/L (ref 98–111)
Creatinine, Ser: 0.76 mg/dL (ref 0.44–1.00)
GFR, Estimated: >60 mL/min (ref >60)
Glucose, Bld: 116 mg/dL — ABNORMAL HIGH (ref 70–99)
Potassium: 4.3 mmol/L (ref 3.5–5.1)
Sodium: 129 mmol/L — ABNORMAL LOW (ref 135–145)
Total Bilirubin: 0.5 mg/dL (ref 0.3–1.2)
Total Protein: 4.8 g/dL — ABNORMAL LOW (ref 6.5–8.1)

## 2020-02-20 LAB — CBC
HCT: 28.3 % — ABNORMAL LOW (ref 36.0–46.0)
Hemoglobin: 9.2 g/dL — ABNORMAL LOW (ref 12.0–15.0)
MCH: 26.4 pg (ref 26.0–34.0)
MCHC: 32.5 g/dL (ref 30.0–36.0)
MCV: 81.3 fL (ref 80.0–100.0)
Platelets: 250 10*3/uL (ref 150–400)
RBC: 3.48 MIL/uL — ABNORMAL LOW (ref 3.87–5.11)
RDW: 13 % (ref 11.5–15.5)
WBC: 8.2 10*3/uL (ref 4.0–10.5)
nRBC: 0 % (ref 0.0–0.2)

## 2020-02-20 NOTE — Lactation Note (Signed)
This note was copied from a baby's chart. Lactation Consultation Note  Patient Name: Meagan Lee [id number] Date: 02/20/2020 Reason for consult: Follow-up assessment;NICU baby;Infant < 6lbs;Late-preterm 34-36.6wks Pre-eclampsia  LC in to visit with P3 Mom of preterm infant in the NICU.  Baby 24 hrs old and being gavage fed formula until Mom's breast milk is available.   Mom states she has pumped 3 times so far.  Encouraged Mom to continue to do breast massage and hand expression, colostrum containers provided, along with double pumping.  Mom aware of benefit of early milk expression regarding her milk volume.    Mom encouraged to do STS with baby in NICU and then hand express/double pump.  Mom does not have a DEBP at home.  Parents aware of pump rental available out of gift shop in hospital, and the DEBP in baby's room in the NICU Mom can use.  Mom aware of hand pump piece that can be used also.  Mom complaining of uterine cramping with pumping, has been painful.  Reassured her that this was normal good, and would subside.  FOB taking EBM to NICU for baby.    Mom denies any questions currently.  Interventions Interventions: Breast feeding basics reviewed;Hand express;Breast massage;Skin to skin;DEBP;Hand pump  Lactation Tools Discussed/Used Tools: Pump Breast pump type: Double-Electric Breast Pump   Consult Status Consult Status: Follow-up Date: 02/21/20 Follow-up type: In-patient    Meagan Lee 02/20/2020, 10:51 AM

## 2020-02-20 NOTE — Progress Notes (Signed)
Daily Postpartum Note  Admission Date: 02/17/2020 Current Date: 02/20/2020 9:56 AM  Myeasha Ziebarth is a 34 y.o. [id number] POD#1 rLTCS @ 33/6, admitted for severe pre-eclampsia and unremitting HA.  Pregnancy complicated by: Patient Active Problem List   Diagnosis Date Noted  . Cesarean delivery delivered 02/19/2020  . History of 2 cesarean sections 02/19/2020  . Preeclampsia, severe 02/17/2020  . History of pre-eclampsia 02/14/2020  . Preeclampsia, third trimester 02/14/2020  . Supervision of high risk pregnancy, antepartum 12/08/2019  . Infertility, female 07/07/2019  . Other fatigue 07/07/2019  . Vitamin D deficiency 07/07/2019  . New daily persistent headache 07/07/2019  . Prior c-section x 2  01/11/2019  . History of pre-eclampsia in prior pregnancy, currently pregnant 01/11/2019    Overnight/24hr events:  none  Subjective:  Hasn't gotten up yet. Minimal PO. Pain controlled. Feeling dizzy from the Mg.   Objective:    Current Vital Signs 24h Vital Sign Ranges  T 98 F (36.7 C) Temp  Avg: 97.9 F (36.6 C)  Min: 97.4 F (36.3 C)  Max: 98.2 F (36.8 C)  BP 126/71 BP  Min: 92/74  Max: 152/99  HR 81 Pulse  Avg: 78.4  Min: 72  Max: 89  RR 18 Resp  Avg: 17  Min: 10  Max: 23  SaO2 99 % Room Air SpO2  Avg: 96.4 %  Min: 94 %  Max: 100 %       24 Hour I/O Current Shift I/O  Time Ins Outs 12/04 0701 - 12/05 0700 In: 3897.2 [P.O.:900; I.V.:2997.2] Out: 2137 [Urine:1700] 12/05 0701 - 12/05 1900 In: 375 [I.V.:375] Out: -    Patient Vitals for the past 24 hrs:  BP Temp Temp src Pulse Resp SpO2  02/20/20 0752 126/71 98 F (36.7 C) Oral 81 18 99 %  02/20/20 0555 -- -- -- -- 17 --  02/20/20 0500 -- -- -- -- 17 --  02/20/20 0356 131/77 98 F (36.7 C) Oral 76 17 98 %  02/20/20 0300 -- -- -- -- 18 --  02/20/20 0200 -- -- -- -- 17 --  02/20/20 0100 -- -- -- -- 18 --  02/20/20 0007 -- -- -- -- 17 --  02/19/20 2324 118/78 97.7 F (36.5 C) Oral 76 18 96 %  02/19/20 2200 -- -- --  -- 18 --  02/19/20 2100 -- -- -- -- 18 --  02/19/20 2000 120/72 97.7 F (36.5 C) Other 80 18 97 %  02/19/20 1635 -- -- -- -- -- 95 %  02/19/20 1630 -- -- -- -- -- 95 %  02/19/20 1625 -- -- -- -- -- 95 %  02/19/20 1620 -- -- -- -- -- 95 %  02/19/20 1615 -- -- -- -- -- 95 %  02/19/20 1610 -- -- -- -- -- 95 %  02/19/20 1605 -- -- -- -- -- 95 %  02/19/20 1602 113/72 98 F (36.7 C) Oral 82 17 95 %  02/19/20 1600 -- -- -- -- -- 94 %  02/19/20 1555 -- -- -- -- -- 95 %  02/19/20 1550 -- -- -- -- -- 94 %  02/19/20 1545 -- -- -- -- -- 94 %  02/19/20 1540 -- -- -- -- -- 94 %  02/19/20 1535 -- -- -- -- -- 95 %  02/19/20 1530 -- -- -- -- -- 95 %  02/19/20 1525 -- -- -- -- -- 95 %  02/19/20 1520 -- -- -- -- -- 94 %  02/19/20 1515 -- -- -- -- --  94 %  02/19/20 1510 -- -- -- -- -- 95 %  02/19/20 1505 -- -- -- -- -- 95 %  02/19/20 1500 -- -- -- -- -- 96 %  02/19/20 1455 -- -- -- -- -- 96 %  02/19/20 1450 -- -- -- -- -- 95 %  02/19/20 1445 -- -- -- -- -- 96 %  02/19/20 1440 -- -- -- -- -- 96 %  02/19/20 1435 -- -- -- -- -- 96 %  02/19/20 1430 -- -- -- -- -- 96 %  02/19/20 1425 -- -- -- -- -- 97 %  02/19/20 1420 -- -- -- -- -- 97 %  02/19/20 1415 -- -- -- -- -- 97 %  02/19/20 1410 -- -- -- -- -- 97 %  02/19/20 1405 -- -- -- -- -- 97 %  02/19/20 1400 -- -- -- -- -- 98 %  02/19/20 1355 -- -- -- -- -- 98 %  02/19/20 1350 -- -- -- -- -- 99 %  02/19/20 1345 -- -- -- -- -- 99 %  02/19/20 1340 -- -- -- -- -- 98 %  02/19/20 1335 -- -- -- -- -- 98 %  02/19/20 1331 129/64 98.2 F (36.8 C) Oral 89 17 99 %  02/19/20 1330 -- -- -- -- -- 98 %  02/19/20 1325 -- -- -- -- -- 97 %  02/19/20 1320 -- -- -- -- -- 98 %  02/19/20 1225 130/86 98.2 F (36.8 C) Oral 81 (!) 22 --  02/19/20 1200 (!) 152/99 97.9 F (36.6 C) Axillary 81 (!) 23 98 %  02/19/20 1145 (!) 144/91 97.8 F (36.6 C) Axillary 75 11 97 %  02/19/20 1135 -- -- -- 72 (!) 21 98 %  02/19/20 1130 134/82 -- -- 73 13 99 %  02/19/20 1115 114/87  97.8 F (36.6 C) Axillary 75 15 100 %  02/19/20 1100 92/74 -- -- 76 14 99 %  02/19/20 1052 129/74 (!) 97.4 F (36.3 C) Oral 80 10 98 %    Physical exam: General: Well nourished, well developed female in no acute distress. Abdomen: rare BS, soft, appropriately ttp. C/d/i dressing Cardiovascular: S1, S2 normal, no murmur, rub or gallop, regular rate and rhythm Respiratory: CTAB Neuro: 1+ brachial Extremities: no clubbing, cyanosis or edema Skin: Warm and dry.   Medications: Current Facility-Administered Medications  Medication Dose Route Frequency Provider Last Rate Last Admin  . acetaminophen (TYLENOL) tablet 650 mg  650 mg Oral Q4H PRN [place] Bing, MD      . calcium carbonate (TUMS - dosed in mg elemental calcium) chewable tablet 400 mg of elemental calcium  2 tablet Oral Q4H PRN [name], MD   400 mg of elemental calcium at 02/18/20 1830  . coconut oil  1 application Topical PRN [place] Bing, MD      . cyclobenzaprine (FLEXERIL) tablet 10 mg  10 mg Oral TID PRN [name], MD      . witch hazel-glycerin (TUCKS) pad 1 application  1 application Topical PRN [place] Bing, MD       And  . dibucaine (NUPERCAINAL) 1 % rectal ointment 1 application  1 application Rectal PRN [place] Bing, MD      . diphenhydrAMINE (BENADRYL) injection 12.5 mg  12.5 mg Intravenous Q4H PRN Marcene Duos, MD   12.5 mg at 02/19/20 1340   Or  . diphenhydrAMINE (BENADRYL) capsule 25 mg  25 mg Oral Q4H PRN Marcene Duos, MD      .  diphenhydrAMINE (BENADRYL) capsule 25 mg  25 mg Oral Q6H PRN [place] Bing, MD      . docusate sodium (COLACE) capsule 100 mg  100 mg Oral Daily [name], MD   100 mg at 02/20/20 9371  . enoxaparin (LOVENOX) injection 40 mg  40 mg Subcutaneous Q24H [place] Bing, MD   40 mg at 02/20/20 0926  . gabapentin (NEURONTIN) capsule 100 mg  100 mg Oral BID [place] Bing, MD   100 mg at 02/20/20 0927  . labetalol (NORMODYNE)  injection 20 mg  20 mg Intravenous PRN [place] Bing, MD       And  . labetalol (NORMODYNE) injection 40 mg  40 mg Intravenous PRN [place] Bing, MD       And  . labetalol (NORMODYNE) injection 80 mg  80 mg Intravenous PRN [place] Bing, MD       And  . hydrALAZINE (APRESOLINE) injection 10 mg  10 mg Intravenous PRN [place] Bing, MD      . ibuprofen (ADVIL) tablet 800 mg  800 mg Oral Q8H [place] Bing, MD   800 mg at 02/20/20 0601  . ketorolac (TORADOL) 30 MG/ML injection 30 mg  30 mg Intravenous Q6H PRN Marcene Duos, MD   30 mg at 02/19/20 1340   Or  . ketorolac (TORADOL) 30 MG/ML injection 30 mg  30 mg Intramuscular Q6H PRN Marcene Duos, MD      . menthol-cetylpyridinium (CEPACOL) lozenge 3 mg  1 lozenge Oral Q2H PRN [place] Bing, MD      . nalbuphine (NUBAIN) injection 5 mg  5 mg Intravenous Q4H PRN Marcene Duos, MD       Or  . nalbuphine (NUBAIN) injection 5 mg  5 mg Subcutaneous Q4H PRN Marcene Duos, MD      . naloxone [facility]) injection 0.4 mg  0.4 mg Intravenous PRN Marcene Duos, MD       And  . sodium chloride flush (NS) 0.9 % injection 3 mL  3 mL Intravenous PRN Marcene Duos, MD      . naloxone HCl [facility]) 2 mg in dextrose 5 % 250 mL infusion  1-4 mcg/kg/hr Intravenous Continuous PRN Marcene Duos, MD      . ondansetron [facility]) injection 4 mg  4 mg Intravenous Q8H PRN Marcene Duos, MD      . oxyCODONE (Oxy IR/ROXICODONE) immediate release tablet 5-10 mg  5-10 mg Oral Q4H PRN [place] Bing, MD      . oxyCODONE-acetaminophen (PERCOCET/ROXICET) 5-325 MG per tablet 1-2 tablet  1-2 tablet Oral Q4H PRN [name], MD   2 tablet at 02/19/20 0031  . prenatal multivitamin tablet 1 tablet  1 tablet Oral Q1200 [place] Bing, MD   1 tablet at 02/20/20 6967  . scopolamine (TRANSDERM-SCOP) 1 MG/3DAYS 1.5 mg  1 patch Transdermal Once Marcene Duos, MD   1.5 mg at 02/19/20 1135  . senna-docusate (Senokot-S)  tablet 2 tablet  2 tablet Oral QHS PRN [place] Bing, MD      . simethicone (MYLICON) chewable tablet 80 mg  80 mg Oral TID Ilean Skill, MD   80 mg at 02/20/20 8938    Labs:  Recent Labs  Lab 02/19/20 0334 02/19/20 0849 02/20/20 0201  WBC 7.9 8.0 8.2  HGB 10.2* 10.8* 9.2*  HCT 32.0* 32.0* 28.3*  PLT 276 291 250    Recent Labs  Lab 02/17/20 1924 02/19/20 0334 02/20/20 0201  NA 134* 135 129*  K 3.7 4.0 4.3  CL 107  106 100  CO2 17* 19* 18*  BUN 9 8 9   CREATININE 0.57 0.55 0.76  CALCIUM 8.7* 8.2* 7.0*  PROT 5.7* 5.4* 4.8*  BILITOT 0.4 0.3 0.5  ALKPHOS 130* 133* 116  ALT 9 8 9   AST 16 16 21   GLUCOSE 100* 85 116*    Radiology:  No new imaging  Assessment & Plan:  Pt doing well *PP: routine care. O pos. Girl. Breast. Unsure about contraception *Severe pre-x: Mg to come off now  Cornelia Copaharlie Armandina Iman, Jr MD Attending Center for [facility] GYN Consult Phone: [phone] (M-F, 0800-1700) & [phone] (Off hours, weekends, holidays)

## 2020-02-21 ENCOUNTER — Ambulatory Visit: Payer: BC Managed Care – PPO

## 2020-02-21 NOTE — Lactation Note (Signed)
This note was copied from a baby's chart. Lactation Consultation Note  Patient Name: Meagan Lee [id number] Date: 02/21/2020   [facility] in to visit with P3 Mom of preterm infant in the NICU.  Baby 48 hrs old.  Mom resting in bed. Mom reports pumping and being able to express drops to take to NICU for baby.    Encouraged Mom to consistently pump both breasts 8 times per 24 hrs as best she can.  Mom signed WIC form, LC faxed referral for Mom to get signed up with [facility] if possible to obtain a DEBP on discharge.  Mom declines any further questions.   Meagan Lee 02/21/2020, 1:48 PM

## 2020-02-21 NOTE — Progress Notes (Signed)
Subjective: Postpartum Day 2: Repeat Cesarean Delivery and [facility]  Patient reports no problems this morning. Denies HA or visual changes. Pain controlled. Ambulating, voiding and tolerating diet.   Objective: Vital signs in last 24 hours: Temp:  [98 F (36.7 C)-99.8 F (37.7 C)] 99.8 F (37.7 C) (12/06 0511) Pulse Rate:  [78-96] 96 (12/06 0511) Resp:  [16-18] 18 (12/06 0511) BP: (121-139)/(65-71) 127/65 (12/06 0511) SpO2:  [98 %-99 %] 98 % (12/06 0511)  Physical Exam:  General: alert Lochia: appropriate Uterine Fundus: firm Incision: healing well DVT Evaluation: No evidence of DVT seen on physical exam.  Recent Labs    02/19/20 0849 02/20/20 0201  HGB 10.8* 9.2*  HCT 32.0* 28.3*    Assessment/Plan: Status post Cesarean section. Doing well postoperatively. S/P magnesium x 24 hours. BP stable without meds Continue current care. Anticipate discharge home tomorrow.   Hermina Staggers 02/21/2020, 7:09 AM

## 2020-02-21 NOTE — Clinical Social Work Maternal (Signed)
CLINICAL SOCIAL WORK MATERNAL/CHILD NOTE  Patient Details  Name: Meagan Lee MRN: [id number] Date of Birth: 03/07/1986  Date:  02/21/2020  Clinical Social Worker Initiating Note:   , LCSW Date/Time: Initiated:  02/21/20/1457     Child's Name:  Meagan Lee   Biological Parents:  Mother, Father (Father: Mohammed Paver)   Need for Interpreter:  None   Reason for Referral:  Other (Comment) (NICU Admission/Resources)   Address:  [address] [place] [address]    Phone number:  [phone] (home)     Additional phone number:   Household Members/Support Persons (HM/SP):   Household Member/Support Person 1, Household Member/Support Person 2, Household Member/Support Person 3   HM/SP Name Relationship DOB or Age  HM/SP -1 Mohammed Depuy Husband/FOB    HM/SP -2 Mosab Lee son 05/17/10  HM/SP -3 Ahmad Lee son 07/04/13  HM/SP -4        HM/SP -5        HM/SP -6        HM/SP -7        HM/SP -8          Natural Supports (not living in the home):  Immediate Family   Professional Supports: None   Employment: Unemployed   Type of Work:     Education:  Some College   Homebound arranged:    Financial Resources:  Private Insurance, Medicaid   Other Resources:  Food Stamps    Cultural/Religious Considerations Which May Impact Care:    Strengths:  Ability to meet basic needs , Understanding of illness   Psychotropic Medications:         Pediatrician:       Pediatrician List:   [place]    [place]    [place]    [place]    [place]    [place]      Pediatrician Fax Number:    Risk Factors/Current Problems:  Transportation    Cognitive State:  Able to Concentrate , Alert , Goal Oriented , Linear Thinking , Insightful    Mood/Affect:  Calm , Interested , Comfortable , Relaxed    CSW Assessment: CSW met with MOB at bedside to discuss infant's NICU admission and consult for resources,  FOB present. CSW offered interpreter services for parents, MOB declined interpreting services. CSW introduced self and explained reason for visit. Parents were welcoming, pleasant and remained engaged during assessment. MOB granted CSW verbal permission to speak in front of FOB about anything. MOB reported that she resides with FOB and receives food stamps. MOB reported that she is interested in WIC because she needs a breast pump, CSW agreed to make referral. MOB was agreeable. MOB reported that they haven't started shopping for infant but they do have the baby bed. CSW informed parents about the hospital car seat program. Parents reported that they were interested in hospital car seat program. CSW agreed to provide car seat closer to discharge. CSW informed parents about the Family Support Network [employer]. Parents reported that they needed assistance with clothing, blankets, diapers and wipes. CSW agreed to make referral. CSW inquired about MOB's support system, MOB reported that her husband and sister are supports.   CSW inquired about MOB's mental health history. MOB denied any mental health history. MOB denied any history of postpartum depression. CSW inquired about how MOB was feeling emotionally after giving birth, MOB reported that she was feeling so excited. MOB presented calm and did not demonstrate any   CLINICAL SOCIAL WORK MATERNAL/CHILD NOTE  Patient Details  Name: Meagan Lee MRN: [id number] Date of Birth: 03/30/1985  Date:  02/21/2020  Clinical Social Worker Initiating Note:   , LCSW Date/Time: Initiated:  02/21/20/1457     Child's Name:  Meagan Lee   Biological Parents:  Mother, Father (Father: Mohammed Blackwelder)   Need for Interpreter:  None   Reason for Referral:  Other (Comment) (NICU Admission/Resources)   Address:  [address] [place] [address]    Phone number:  [phone] (home)     Additional phone number:   Household Members/Support Persons (HM/SP):   Household Member/Support Person 1, Household Member/Support Person 2, Household Member/Support Person 3   HM/SP Name Relationship DOB or Age  HM/SP -1 Mohammed Gripp Husband/FOB    HM/SP -2 Mosab Lee son 05/17/10  HM/SP -3 Ahmad Lee son 07/04/13  HM/SP -4        HM/SP -5        HM/SP -6        HM/SP -7        HM/SP -8          Natural Supports (not living in the home):  Immediate Family   Professional Supports: None   Employment: Unemployed   Type of Work:     Education:  Some College   Homebound arranged:    Financial Resources:  Private Insurance, Medicaid   Other Resources:  Food Stamps    Cultural/Religious Considerations Which May Impact Care:    Strengths:  Ability to meet basic needs , Understanding of illness   Psychotropic Medications:         Pediatrician:       Pediatrician List:   [place]    [place]     [place]    [place]    [place]    [place]      Pediatrician Fax Number:    Risk Factors/Current Problems:  Transportation    Cognitive State:  Able to Concentrate , Alert , Goal Oriented , Linear Thinking , Insightful    Mood/Affect:  Calm , Interested , Comfortable , Relaxed    CSW Assessment: CSW met with MOB at bedside to discuss infant's NICU admission and consult for resources,  FOB present. CSW offered interpreter services for parents, MOB declined interpreting services. CSW introduced self and explained reason for visit. Parents were welcoming, pleasant and remained engaged during assessment. MOB granted CSW verbal permission to speak in front of FOB about anything. MOB reported that she resides with FOB and receives food stamps. MOB reported that she is interested in WIC because she needs a breast pump, CSW agreed to make referral. MOB was agreeable. MOB reported that they haven't started shopping for infant but they do have the baby bed. CSW informed parents about the hospital car seat program. Parents reported that they were interested in hospital car seat program. CSW agreed to provide car seat closer to discharge. CSW informed parents about the Family Support [employer]. Parents reported that they needed assistance with clothing, blankets, diapers and wipes. CSW agreed to make referral. CSW inquired about MOB's support system, MOB reported that her husband and sister are supports.   CSW inquired about MOB's mental health history. MOB denied any mental health history. MOB denied any history of postpartum depression. CSW inquired about how MOB was feeling emotionally after giving birth, MOB reported that she was feeling so excited. MOB presented calm and did not demonstrate any

## 2020-02-22 LAB — SURGICAL PATHOLOGY

## 2020-02-22 MED ORDER — IBUPROFEN 800 MG PO TABS
800.0000 mg | ORAL_TABLET | Freq: Three times a day (TID) | ORAL | 0 refills | Status: DC
Start: 2020-02-22 — End: 2022-04-18

## 2020-02-22 MED ORDER — OXYCODONE-ACETAMINOPHEN 5-325 MG PO TABS: 1.0000 | ORAL_TABLET | ORAL | 0 refills | Status: DC | PRN

## 2020-02-22 NOTE — Plan of Care (Signed)
  Problem: Education: Goal: Knowledge of General Education information will improve Description: Including pain rating scale, medication(s)/side effects and non-pharmacologic comfort measures Outcome: Adequate for Discharge   Problem: Health Behavior/Discharge Planning: Goal: Ability to manage health-related needs will improve Outcome: Adequate for Discharge   Problem: Clinical Measurements: Goal: Ability to maintain clinical measurements within normal limits will improve Outcome: Adequate for Discharge Goal: Will remain free from infection Outcome: Adequate for Discharge Goal: Diagnostic test results will improve Outcome: Adequate for Discharge Goal: Respiratory complications will improve Outcome: Adequate for Discharge Goal: Cardiovascular complication will be avoided Outcome: Adequate for Discharge   Problem: Activity: Goal: Risk for activity intolerance will decrease Outcome: Adequate for Discharge   Problem: Nutrition: Goal: Adequate nutrition will be maintained Outcome: Adequate for Discharge   Problem: Coping: Goal: Level of anxiety will decrease Outcome: Adequate for Discharge   Problem: Elimination: Goal: Will not experience complications related to bowel motility Outcome: Adequate for Discharge Goal: Will not experience complications related to urinary retention Outcome: Adequate for Discharge   Problem: Pain Managment: Goal: General experience of comfort will improve Outcome: Adequate for Discharge   Problem: Safety: Goal: Ability to remain free from injury will improve Outcome: Adequate for Discharge   Problem: Skin Integrity: Goal: Risk for impaired skin integrity will decrease Outcome: Adequate for Discharge   Problem: Education: Goal: Knowledge of disease or condition will improve Outcome: Adequate for Discharge Goal: Knowledge of the prescribed therapeutic regimen will improve Outcome: Adequate for Discharge   Problem: Fluid Volume: Goal:  Peripheral tissue perfusion will improve Outcome: Adequate for Discharge   Problem: Clinical Measurements: Goal: Complications related to disease process, condition or treatment will be avoided or minimized Outcome: Adequate for Discharge   Problem: Education: Goal: Knowledge of disease or condition will improve Outcome: Adequate for Discharge Goal: Knowledge of the prescribed therapeutic regimen will improve Outcome: Adequate for Discharge Goal: Individualized Educational Video(s) Outcome: Adequate for Discharge   Problem: Clinical Measurements: Goal: Complications related to the disease process, condition or treatment will be avoided or minimized Outcome: Adequate for Discharge   Problem: Education: Goal: Knowledge of condition will improve Outcome: Adequate for Discharge Goal: Individualized Educational Video(s) Outcome: Adequate for Discharge Goal: Individualized Newborn Educational Video(s) Outcome: Adequate for Discharge   Problem: Activity: Goal: Will verbalize the importance of balancing activity with adequate rest periods Outcome: Adequate for Discharge Goal: Ability to tolerate increased activity will improve Outcome: Adequate for Discharge   Problem: Coping: Goal: Ability to identify and utilize available resources and services will improve Outcome: Adequate for Discharge   Problem: Life Cycle: Goal: Chance of risk for complications during the postpartum period will decrease Outcome: Adequate for Discharge   Problem: Role Relationship: Goal: Ability to demonstrate positive interaction with newborn will improve Outcome: Adequate for Discharge   Problem: Skin Integrity: Goal: Demonstration of wound healing without infection will improve Outcome: Adequate for Discharge

## 2020-02-22 NOTE — Progress Notes (Signed)
I have reviewed and concur with this student's documentation.   

## 2020-02-22 NOTE — Lactation Note (Signed)
This note was copied from a baby's chart. Lactation Consultation Note  Patient Name: Meagan Lee First YYTKP'T Date: 02/22/2020   Baby 72 hours old in NICU.  [redacted]w[redacted]d CGA.   Called to room by [employer] since mother had questions regarding DEBP. Mother has declined interpretation for Arabic.  LC confirmed with mother. Reviewed engorgement care, pumping frequency, milk storage and cleaning pump parts. During consult mother received call from [facility] so she could pick up her DEBP today. Reviewed manual pump use and bring pump parts to NICU to pump at bedside.       Maternal Data    Feeding Feeding Type: Formula  LATCH Score                   Interventions    Lactation Tools Discussed/Used     Consult Status      Hardie Pulley 02/22/2020, 10:43 AM

## 2020-02-22 NOTE — Discharge Instructions (Signed)
Preeclampsia and Eclampsia Preeclampsia is a serious condition that may develop during pregnancy. This condition causes high blood pressure and increased protein in your urine along with other symptoms, such as headaches and vision changes. These symptoms may develop as the condition gets worse. Preeclampsia may occur at 20 weeks of pregnancy or later. Diagnosing and treating preeclampsia early is very important. If not treated early, it can cause serious problems for you and your baby. One problem it can lead to is eclampsia. Eclampsia is a condition that causes muscle jerking or shaking (convulsions or seizures) and other serious problems for the mother. During pregnancy, delivering your baby may be the best treatment for preeclampsia or eclampsia. For most women, preeclampsia and eclampsia symptoms go away after giving birth. In rare cases, a woman may develop preeclampsia after giving birth (postpartum preeclampsia). This usually occurs within 48 hours after childbirth but may occur up to 6 weeks after giving birth. What are the causes? The cause of preeclampsia is not known. What increases the risk? The following risk factors make you more likely to develop preeclampsia:  Being pregnant for the first time.  Having had preeclampsia during a past pregnancy.  Having a family history of preeclampsia.  Having high blood pressure.  Being pregnant with more than one baby.  Being 35 or older.  Being African-American.  Having kidney disease or diabetes.  Having medical conditions such as lupus or blood diseases.  Being very overweight (obese). What are the signs or symptoms? The most common symptoms are:  Severe headaches.  Vision problems, such as blurred or double vision.  Abdominal pain, especially upper abdominal pain. Other symptoms that may develop as the condition gets worse include:  Sudden weight gain.  Sudden swelling of the hands, face, legs, and feet.  Severe nausea  and vomiting.  Numbness in the face, arms, legs, and feet.  Dizziness.  Urinating less than usual.  Slurred speech.  Convulsions or seizures. How is this diagnosed? There are no screening tests for preeclampsia. Your health care provider will ask you about symptoms and check for signs of preeclampsia during your prenatal visits. You may also have tests that include:  Checking your blood pressure.  Urine tests to check for protein. Your health care provider will check for this at every prenatal visit.  Blood tests.  Monitoring your baby's heart rate.  Ultrasound. How is this treated? You and your health care provider will determine the treatment approach that is best for you. Treatment may include:  Having more frequent prenatal exams to check for signs of preeclampsia, if you have an increased risk for preeclampsia.  Medicine to lower your blood pressure.  Staying in the hospital, if your condition is severe. There, treatment will focus on controlling your blood pressure and the amount of fluids in your body (fluid retention).  Taking medicine (magnesium sulfate) to prevent seizures. This may be given as an injection or through an IV.  Taking a low-dose aspirin during your pregnancy.  Delivering your baby early. You may have your labor started with medicine (induced), or you may have a cesarean delivery. Follow these instructions at home: Eating and drinking   Drink enough fluid to keep your urine pale yellow.  Avoid caffeine. Lifestyle  Do not use any products that contain nicotine or tobacco, such as cigarettes and e-cigarettes. If you need help quitting, ask your health care provider.  Do not use alcohol or drugs.  Avoid stress as much as possible. Rest and get   plenty of sleep. General instructions  Take over-the-counter and prescription medicines only as told by your health care provider.  When lying down, lie on your left side. This keeps pressure off your  major blood vessels.  When sitting or lying down, raise (elevate) your feet. Try putting some pillows underneath your lower legs.  Exercise regularly. Ask your health care provider what kinds of exercise are best for you.  Keep all follow-up and prenatal visits as told by your health care provider. This is important. How is this prevented? There is no known way of preventing preeclampsia or eclampsia from developing. However, to lower your risk of complications and detect problems early:  Get regular prenatal care. Your health care provider may be able to diagnose and treat the condition early.  Maintain a healthy weight. Ask your health care provider for help managing weight gain during pregnancy.  Work with your health care provider to manage any long-term (chronic) health conditions you have, such as diabetes or kidney problems.  You may have tests of your blood pressure and kidney function after giving birth.  Your health care provider may have you take low-dose aspirin during your next pregnancy. Contact a health care provider if:  You have symptoms that your health care provider told you may require more treatment or monitoring, such as: ? Headaches. ? Nausea or vomiting. ? Abdominal pain. ? Dizziness. ? Light-headedness. Get help right away if:  You have severe: ? Abdominal pain. ? Headaches that do not get better. ? Dizziness. ? Vision problems. ? Confusion. ? Nausea or vomiting.  You have any of the following: ? A seizure. ? Sudden, rapid weight gain. ? Sudden swelling in your hands, ankles, or face. ? Trouble moving any part of your body. ? Numbness in any part of your body. ? Trouble speaking. ? Abnormal bleeding.  You faint. Summary  Preeclampsia is a serious condition that may develop during pregnancy.  This condition causes high blood pressure and increased protein in your urine along with other symptoms, such as headaches and vision  changes.  Diagnosing and treating preeclampsia early is very important. If not treated early, it can cause serious problems for you and your baby.  Get help right away if you have symptoms that your health care provider told you to watch for. This information is not intended to replace advice given to you by your health care provider. Make sure you discuss any questions you have with your health care provider. Document Revised: 11/04/2017 Document Reviewed: 10/09/2015 Elsevier Patient Education  2020 [employer] Cesarean Delivery, Care After This sheet gives you information about how to care for yourself after your procedure. Your health care provider may also give you more specific instructions. If you have problems or questions, contact your health care provider. What can I expect after the procedure? After the procedure, it is common to have:  A small amount of blood or clear fluid coming from the incision.  Some redness, swelling, and pain in your incision area.  Some abdominal pain and soreness.  Vaginal bleeding (lochia). Even though you did not have a vaginal delivery, you will still have vaginal bleeding and discharge.  Pelvic cramps.  Fatigue. You may have pain, swelling, and discomfort in the tissue between your vagina and your anus (perineum) if:  Your C-section was unplanned, and you were allowed to labor and push.  An incision was made in the area (episiotomy) or the tissue tore during attempted vaginal delivery. Follow these   instructions at home: Incision care   Follow instructions from your health care provider about how to take care of your incision. Make sure you: ? Wash your hands with soap and water before you change your bandage (dressing). If soap and water are not available, use hand sanitizer. ? If you have a dressing, change it or remove it as told by your health care provider. ? Leave stitches (sutures), skin staples, skin glue, or adhesive strips in  place. These skin closures may need to stay in place for 2 weeks or longer. If adhesive strip edges start to loosen and curl up, you may trim the loose edges. Do not remove adhesive strips completely unless your health care provider tells you to do that.  Check your incision area every day for signs of infection. Check for: ? More redness, swelling, or pain. ? More fluid or blood. ? Warmth. ? Pus or a bad smell.  Do not take baths, swim, or use a hot tub until your health care provider says it's okay. Ask your health care provider if you can take showers.  When you cough or sneeze, hug a pillow. This helps with pain and decreases the chance of your incision opening up (dehiscing). Do this until your incision heals. Medicines  Take over-the-counter and prescription medicines only as told by your health care provider.  If you were prescribed an antibiotic medicine, take it as told by your health care provider. Do not stop taking the antibiotic even if you start to feel better.  Do not drive or use heavy machinery while taking prescription pain medicine. Lifestyle  Do not drink alcohol. This is especially important if you are breastfeeding or taking pain medicine.  Do not use any products that contain nicotine or tobacco, such as cigarettes, e-cigarettes, and chewing tobacco. If you need help quitting, ask your health care provider. Eating and drinking  Drink at least 8 eight-ounce glasses of water every day unless told not to by your health care provider. If you breastfeed, you may need to drink even more water.  Eat high-fiber foods every day. These foods may help prevent or relieve constipation. High-fiber foods include: ? Whole grain cereals and breads. ? Brown rice. ? Beans. ? Fresh fruits and vegetables. Activity   If possible, have someone help you care for your baby and help with household activities for at least a few days after you leave the hospital.  Return to your  normal activities as told by your health care provider. Ask your health care provider what activities are safe for you.  Rest as much as possible. Try to rest or take a nap while your baby is sleeping.  Do not lift anything that is heavier than 10 lbs (4.5 kg), or the limit that you were told, until your health care provider says that it is safe.  Talk with your health care provider about when you can engage in sexual activity. This may depend on your: ? Risk of infection. ? How fast you heal. ? Comfort and desire to engage in sexual activity. General instructions  Do not use tampons or douches until your health care provider approves.  Wear loose, comfortable clothing and a supportive and well-fitting bra.  Keep your perineum clean and dry. Wipe from front to back when you use the toilet.  If you pass a blood clot, save it and call your health care provider to discuss. Do not flush blood clots down the toilet before you get instructions   from your health care provider.  Keep all follow-up visits for you and your baby as told by your health care provider. This is important. Contact a health care provider if:  You have: ? A fever. ? Bad-smelling vaginal discharge. ? Pus or a bad smell coming from your incision. ? Difficulty or pain when urinating. ? A sudden increase or decrease in the frequency of your bowel movements. ? More redness, swelling, or pain around your incision. ? More fluid or blood coming from your incision. ? A rash. ? Nausea. ? Little or no interest in activities you used to enjoy. ? Questions about caring for yourself or your baby.  Your incision feels warm to the touch.  Your breasts turn red or become painful or hard.  You feel unusually sad or worried.  You vomit.  You pass a blood clot from your vagina.  You urinate more than usual.  You are dizzy or light-headed. Get help right away if:  You have: ? Pain that does not go away or get better with  medicine. ? Chest pain. ? Difficulty breathing. ? Blurred vision or spots in your vision. ? Thoughts about hurting yourself or your baby. ? New pain in your abdomen or in one of your legs. ? A severe headache.  You faint.  You bleed from your vagina so much that you fill more than one sanitary pad in one hour. Bleeding should not be heavier than your heaviest period. Summary  After the procedure, it is common to have pain at your incision site, abdominal cramping, and slight bleeding from your vagina.  Check your incision area every day for signs of infection.  Tell your health care provider about any unusual symptoms.  Keep all follow-up visits for you and your baby as told by your health care provider. This information is not intended to replace advice given to you by your health care provider. Make sure you discuss any questions you have with your health care provider. Document Revised: 09/10/2017 Document Reviewed: 09/10/2017 Elsevier Patient Education  2020 [employer]  

## 2020-02-23 ENCOUNTER — Encounter: Payer: BC Managed Care – PPO | Admitting: Obstetrics & Gynecology

## 2020-02-24 ENCOUNTER — Inpatient Hospital Stay (HOSPITAL_COMMUNITY)
Admission: AD | Admit: 2020-02-24 | Discharge: 2020-02-24 | Disposition: A | Discharge status: Home / Self Care | Payer: BC Managed Care – PPO | Attending: Obstetrics and Gynecology | Admitting: Obstetrics and Gynecology

## 2020-02-24 ENCOUNTER — Other Ambulatory Visit: Payer: Self-pay

## 2020-02-24 ENCOUNTER — Encounter (HOSPITAL_COMMUNITY): Payer: Self-pay | Admitting: Obstetrics and Gynecology

## 2020-02-24 ENCOUNTER — Telehealth: Payer: Self-pay | Admitting: Lactation Services

## 2020-02-24 DIAGNOSIS — Z8759 Personal history of other complications of pregnancy, childbirth and the puerperium: Secondary | ICD-10-CM | POA: Diagnosis not present

## 2020-02-24 DIAGNOSIS — O9089 Other complications of the puerperium, not elsewhere classified: Secondary | ICD-10-CM | POA: Insufficient documentation

## 2020-02-24 DIAGNOSIS — O99893 Other specified diseases and conditions complicating puerperium: Secondary | ICD-10-CM

## 2020-02-24 DIAGNOSIS — O165 Unspecified maternal hypertension, complicating the puerperium: Secondary | ICD-10-CM

## 2020-02-24 DIAGNOSIS — R030 Elevated blood-pressure reading, without diagnosis of hypertension: Secondary | ICD-10-CM | POA: Insufficient documentation

## 2020-02-24 DIAGNOSIS — R519 Headache, unspecified: Secondary | ICD-10-CM | POA: Diagnosis present

## 2020-02-24 HISTORY — DX: Essential (primary) hypertension: I10

## 2020-02-24 HISTORY — DX: Gestational (pregnancy-induced) hypertension without significant proteinuria, unspecified trimester: O13.9

## 2020-02-24 LAB — CBC
HCT: 34 % — ABNORMAL LOW (ref 36.0–46.0)
Hemoglobin: 10.6 g/dL — ABNORMAL LOW (ref 12.0–15.0)
MCH: 25.9 pg — ABNORMAL LOW (ref 26.0–34.0)
MCHC: 31.2 g/dL (ref 30.0–36.0)
MCV: 82.9 fL (ref 80.0–100.0)
Platelets: 388 10*3/uL (ref 150–400)
RBC: 4.1 MIL/uL (ref 3.87–5.11)
RDW: 13.7 % (ref 11.5–15.5)
WBC: 9.2 10*3/uL (ref 4.0–10.5)
nRBC: 0.2 % (ref 0.0–0.2)

## 2020-02-24 LAB — COMPREHENSIVE METABOLIC PANEL
ALT: 34 U/L (ref 0–44)
AST: 39 U/L (ref 15–41)
Albumin: 2.8 g/dL — ABNORMAL LOW (ref 3.5–5.0)
Alkaline Phosphatase: 112 U/L (ref 38–126)
Anion gap: 12 (ref 5–15)
BUN: 8 mg/dL (ref 6–20)
CO2: 20 mmol/L — ABNORMAL LOW (ref 22–32)
Calcium: 9.2 mg/dL (ref 8.9–10.3)
Chloride: 105 mmol/L (ref 98–111)
Creatinine, Ser: 0.48 mg/dL (ref 0.44–1.00)
GFR, Estimated: >60 mL/min (ref >60)
Glucose, Bld: 84 mg/dL (ref 70–99)
Potassium: 4.4 mmol/L (ref 3.5–5.1)
Sodium: 137 mmol/L (ref 135–145)
Total Bilirubin: 0.6 mg/dL (ref 0.3–1.2)
Total Protein: 6.7 g/dL (ref 6.5–8.1)

## 2020-02-24 MED ORDER — NIFEDIPINE ER 30 MG PO TB24
30.0000 mg | ORAL_TABLET | Freq: Every day | ORAL | 2 refills | Status: DC
Start: 2020-02-25 — End: 2020-04-14

## 2020-02-24 MED ORDER — CYCLOBENZAPRINE HCL 5 MG PO TABS: 5.0000 mg | ORAL_TABLET | Freq: Two times a day (BID) | ORAL | 0 refills | Status: DC | PRN

## 2020-02-24 MED ORDER — NIFEDIPINE ER OSMOTIC RELEASE 30 MG PO TB24
30.0000 mg | ORAL_TABLET | Freq: Every day | ORAL | Status: DC
  Administered 2020-02-24: 30 mg via ORAL
  Filled 2020-02-24: qty 1

## 2020-02-24 MED ORDER — CYCLOBENZAPRINE HCL 5 MG PO TABS
10.0000 mg | ORAL_TABLET | Freq: Once | ORAL | Status: AC
  Administered 2020-02-24: 10 mg via ORAL
  Filled 2020-02-24: qty 2

## 2020-02-24 MED ORDER — IBUPROFEN 800 MG PO TABS
800.0000 mg | ORAL_TABLET | Freq: Once | ORAL | Status: AC
  Administered 2020-02-24: 800 mg via ORAL
  Filled 2020-02-24: qty 1

## 2020-02-24 NOTE — [facility] Note (Signed)
Patient reports to [facility] with complaints of headache and elevated blood pressures at home. Patient denies any altered vision or epigastric pain. Patient denies any fevers or foul smelling lochia. Triage assessment completed. Provider to be notified

## 2020-02-24 NOTE — MAU Provider Note (Signed)
History     [id number]  Arrival date and time: 02/24/20 1242    Chief Complaint  Patient presents with  . Headache  . Hypertension     HPI Meagan Lee is a 34 y.o. status post repeat cesarean section #3 on December 4 secondary to severe preeclampsia, who presents for elevated blood pressures with home headache.   Review of discharge summary from last admission on: admitted from December 2 to December 7, initially admitted for preeclampsia with severe features based on headache and blood pressures.  And had an uncomplicated repeat cesarean section unremarkable postpartum course.  At time of discharge her blood pressures still ranging 120s to 140s over 50s to 80s, is not discharged on any antihypertensive medications.  Today on history reports was having elevated BP's at home, directed to come to the MAU for an evaluation Brings home machine with her, values have ranged mostly 140-150's/80-100's, two severe ranges in machine of 169/116 and 178/107 Reports severe range BP's were several days ago, more recent readings are from the past few days and this morning Endorses headache at present No vision changes No chest pain or SOB No RUQ pain No LE edema Minimal bleeding  O/Positive/-- (09/22 1645)  OB History    Gravida  4   Para  3   Term  2   Preterm  1   AB  1   Living  3     SAB  1   IAB      Ectopic      Multiple  0   Live Births  3           Past Medical History:  Diagnosis Date  . Hypertension   . Pregnancy induced hypertension     Past Surgical History:  Procedure Laterality Date  . CESAREAN SECTION     x2  . CESAREAN SECTION N/A 02/19/2020   Procedure: CESAREAN SECTION;  Surgeon: Alex Bing, MD;  Location: [facility];  Service: Obstetrics;  Laterality: N/A;    Family History  Problem Relation Age of Onset  . Hypertension Maternal Grandmother   . Asthma Maternal Grandfather   . Hypertension Paternal Aunt   . Hypertension Paternal  Uncle     Social History   Socioeconomic History  . Marital status: Married    Spouse name: Not on file  . Number of children: Not on file  . Years of education: Not on file  . Highest education level: Not on file  Occupational History  . Not on file  Tobacco Use  . Smoking status: Never Smoker  . Smokeless tobacco: Never Used  Vaping Use  . Vaping Use: Never used  Substance and Sexual Activity  . Alcohol use: No  . Drug use: No  . Sexual activity: Yes    Birth control/protection: None  Other Topics Concern  . Not on file  Social History Narrative  . Not on file   Social Determinants of Health   Financial Resource Strain: Not on file  Food Insecurity: No Food Insecurity  . Worried About Programme researcher, broadcasting/film/video in the Last Year: Never true  . Ran Out of Food in the Last Year: Never true  Transportation Needs: No Transportation Needs  . Lack of Transportation (Medical): No  . Lack of Transportation (Non-Medical): No  Physical Activity: Not on file  Stress: Not on file  Social Connections: Socially Integrated  . Frequency of Communication with Friends and Family: More than three times a week  .  Frequency of Social Gatherings with Friends and Family: More than three times a week  . Attends Religious Services: More than 4 times per year  . Active Member of Clubs or Organizations: Yes  . Attends Banker Meetings: More than 4 times per year  . Marital Status: Married  Catering manager Violence: Not At Risk  . Fear of Current or Ex-Partner: No  . Emotionally Abused: No  . Physically Abused: No  . Sexually Abused: No    Allergies  Allergen Reactions  . Dexamethasone Anaphylaxis    No current facility-administered medications on file prior to encounter.   Current Outpatient Medications on File Prior to Encounter  Medication Sig Dispense Refill  . Butalbital-APAP-Caffeine 50-325-40 MG capsule Take 1-2 capsules by mouth every 6 (six) hours as needed for  headache. 30 capsule 0  . ibuprofen (ADVIL) 800 MG tablet Take 1 tablet (800 mg total) by mouth every 8 (eight) hours. 30 tablet 0  . oxyCODONE-acetaminophen (PERCOCET/ROXICET) 5-325 MG tablet Take 1 tablet by mouth every 4 (four) hours as needed (headache). 20 tablet 0  . acetaminophen (TYLENOL) 650 MG CR tablet Take 650 mg by mouth every 8 (eight) hours as needed for pain.    . Prenatal Vit-Fe Fumarate-FA (CLASSIC PRENATAL) 28-0.8 MG TABS Take 1 tablet by mouth daily.       ROS Pertinent positives and negative per HPI, all others reviewed and negative  Physical Exam   BP 138/76   Pulse 96   Temp 98 F (36.7 C) (Oral)   Resp 18   Ht 5\' 4"  (1.626 m)   Wt 79.6 kg   BMI 30.12 kg/m   Physical Exam Vitals reviewed.  Constitutional:      General: She is not in acute distress.    Appearance: She is well-developed and well-nourished. She is not diaphoretic.  Eyes:     General: No scleral icterus. Pulmonary:     Effort: Pulmonary effort is normal. No respiratory distress.  Abdominal:     General: There is no distension.     Palpations: Abdomen is soft.     Tenderness: There is no abdominal tenderness. There is no guarding or rebound.  Musculoskeletal:        General: No edema.  Skin:    General: Skin is warm and dry.  Neurological:     Mental Status: She is alert.     Coordination: Coordination normal.  Psychiatric:        Mood and Affect: Mood and affect normal.      Cervical Exam    Bedside Ultrasound Not done  My interpretation: n/a  FHT n/a  Labs Results for orders placed or performed during the hospital encounter of 02/24/20 (from the past 24 hour(s))  CBC     Status: Abnormal   Collection Time: 02/24/20  2:14 PM  Result Value Ref Range   WBC 9.2 4.0 - 10.5 K/uL   RBC 4.10 3.87 - 5.11 MIL/uL   Hemoglobin 10.6 (L) 12.0 - 15.0 g/dL   HCT 14/09/21 (L) 99.8 - 33.8 %   MCV 82.9 80.0 - 100.0 fL   MCH 25.9 (L) 26.0 - 34.0 pg   MCHC 31.2 30.0 - 36.0 g/dL   RDW  25.0 53.9 - 76.7 %   Platelets 388 150 - 400 K/uL   nRBC 0.2 0.0 - 0.2 %  Comprehensive metabolic panel     Status: Abnormal   Collection Time: 02/24/20  2:14 PM  Result Value Ref Range  Sodium 137 135 - 145 mmol/L   Potassium 4.4 3.5 - 5.1 mmol/L   Chloride 105 98 - 111 mmol/L   CO2 20 (L) 22 - 32 mmol/L   Glucose, Bld 84 70 - 99 mg/dL   BUN 8 6 - 20 mg/dL   Creatinine, Ser 9.32 0.44 - 1.00 mg/dL   Calcium 9.2 8.9 - 35.5 mg/dL   Total Protein 6.7 6.5 - 8.1 g/dL   Albumin 2.8 (L) 3.5 - 5.0 g/dL   AST 39 15 - 41 U/L   ALT 34 0 - 44 U/L   Alkaline Phosphatase 112 38 - 126 U/L   Total Bilirubin 0.6 0.3 - 1.2 mg/dL   GFR, Estimated >73 >22 mL/min   Anion gap 12 5 - 15    Imaging No results found.  MAU Course  Procedures  Lab Orders     CBC     Comprehensive metabolic panel Meds ordered this encounter  Medications  . ibuprofen (ADVIL) tablet 800 mg  . cyclobenzaprine (FLEXERIL) tablet 10 mg  . NIFEdipine (PROCARDIA-XL/NIFEDICAL-XL) 24 hr tablet 30 mg   Imaging Orders  No imaging studies ordered today    MDM moderate  Assessment and Plan  #History of severe pre-eclampsia, delivered #Mild range BP's #Headache Patient presenting 2 days post partum with elevated BP and headache, was not discharged with any meds due to well controlled BP. Will treat headache with ibuprofen and flexeril, start Nifedipine 30mg  XL, and repeat CBC/CMP. Dispo pending workup.  3:54 PM  Headache and BP's improved with treatment, labs unremarkable except for mildly increased LFT's which are still normal range, overall low suspicion for recurrent severe PreE at this time. Will send Rx for Nifedipine, already has nurse visit scheduled for beginning of next week.   D/c to home in stable condition.   

## 2020-02-24 NOTE — Discharge Instructions (Signed)
Postpartum Hypertension Postpartum hypertension is high blood pressure that remains higher than normal after childbirth. You may not realize that you have postpartum hypertension if your blood pressure is not being checked regularly. In most cases, postpartum hypertension will go away on its own, usually within a week of delivery. However, for some women, medical treatment is required to prevent serious complications, such as seizures or stroke. What are the causes? This condition may be caused by one or more of the following:  Hypertension that existed before pregnancy (chronic hypertension).  Hypertension that comes on as a result of pregnancy (gestational hypertension).  Hypertensive disorders during pregnancy (preeclampsia) or seizures in women who have high blood pressure during pregnancy (eclampsia).  A condition in which the liver, platelets, and red blood cells are damaged during pregnancy (HELLP syndrome).  A condition in which the thyroid produces too much hormones (hyperthyroidism).  Other rare problems of the nerves (neurological disorders) or blood disorders. In some cases, the cause may not be known. What increases the risk? The following factors may make you more likely to develop this condition:  Chronic hypertension. In some cases, this may not have been diagnosed before pregnancy.  Obesity.  Type 2 diabetes.  Kidney disease.  History of preeclampsia or eclampsia.  Other medical conditions that change the level of hormones in the body (hormonal imbalance). What are the signs or symptoms? As with all types of hypertension, postpartum hypertension may not have any symptoms. Depending on how high your blood pressure is, you may experience:  Headaches. These may be mild, moderate, or severe. They may also be steady, constant, or sudden in onset (thunderclap headache).  Changes in your ability to see (visual changes).  Dizziness.  Shortness of breath.  Swelling  of your hands, feet, lower legs, or face. In some cases, you may have swelling in more than one of these locations.  Heart palpitations or a racing heartbeat.  Difficulty breathing while lying down.  Decrease in the amount of urine that you pass. Other rare signs and symptoms may include:  Sweating more than usual. This lasts longer than a few days after delivery.  Chest pain.  Sudden dizziness when you get up from sitting or lying down.  Seizures.  Nausea or vomiting.  Abdominal pain. How is this diagnosed? This condition may be diagnosed based on the results of a physical exam, blood pressure measurements, and blood and urine tests. You may also have other tests, such as a CT scan or an MRI, to check for other problems of postpartum hypertension. How is this treated? If blood pressure is high enough to require treatment, your options may include:  Medicines to reduce blood pressure (antihypertensives). Tell your health care provider if you are breastfeeding or if you plan to breastfeed. There are many antihypertensive medicines that are safe to take while breastfeeding.  Stopping medicines that may be causing hypertension.  Treating medical conditions that are causing hypertension.  Treating the complications of hypertension, such as seizures, stroke, or kidney problems. Your health care provider will also continue to monitor your blood pressure closely until it is within a safe range for you. Follow these instructions at home:  Take over-the-counter and prescription medicines only as told by your health care provider.  Return to your normal activities as told by your health care provider. Ask your health care provider what activities are safe for you.  Do not use any products that contain nicotine or tobacco, such as cigarettes and e-cigarettes. If   you need help quitting, ask your health care provider.  Keep all follow-up visits as told by your health care provider. This  is important. Contact a health care provider if:  Your symptoms get worse.  You have new symptoms, such as: ? A headache that does not get better. ? Dizziness. ? Visual changes. Get help right away if:  You suddenly develop swelling in your hands, ankles, or face.  You have sudden, rapid weight gain.  You develop difficulty breathing, chest pain, racing heartbeat, or heart palpitations.  You develop severe pain in your abdomen.  You have any symptoms of a stroke. "BE FAST" is an easy way to remember the main warning signs of a stroke: ? B - Balance. Signs are dizziness, sudden trouble walking, or loss of balance. ? E - Eyes. Signs are trouble seeing or a sudden change in vision. ? F - Face. Signs are sudden weakness or numbness of the face, or the face or eyelid drooping on one side. ? A - Arms. Signs are weakness or numbness in an arm. This happens suddenly and usually on one side of the body. ? S - Speech. Signs are sudden trouble speaking, slurred speech, or trouble understanding what people say. ? T - Time. Time to call emergency services. Write down what time symptoms started.  You have other signs of a stroke, such as: ? A sudden, severe headache with no known cause. ? Nausea or vomiting. ? Seizure. These symptoms may represent a serious problem that is an emergency. Do not wait to see if the symptoms will go away. Get medical help right away. Call your local emergency services (911 in the [place] Do not drive yourself to the hospital. Summary  Postpartum hypertension is high blood pressure that remains higher than normal after childbirth.  In most cases, postpartum hypertension will go away on its own, usually within a week of delivery.  For some women, medical treatment is required to prevent serious complications, such as seizures or stroke. This information is not intended to replace advice given to you by your health care provider. Make sure you discuss any questions  you have with your health care provider. Document Revised: 04/10/2018 Document Reviewed: 12/23/2016 Elsevier Patient Education  2020 Elsevier Inc.  

## 2020-02-24 NOTE — Telephone Encounter (Signed)
Called patient with assistance of [facility] Arabic Telephone Interpreter [place] # [phone].   Patient sent message that she would like a nurse to call her after she sent a message that she needed to go to MAU for evaluation.   Patient said she did get the message to go to the MAU for evaluation. She reports her questions are what they will do once she gets to MAU. Informed her that will be dependent on what they find, they will decide if she needs medications to lower BP.   Patient reports she is planning to go to MAU for evaluation.   Called MAU and spoke with [facility] and gave report that patient is coming in for evaluation.

## 2020-02-24 NOTE — Telephone Encounter (Signed)
Called patient with assistance of [facility] Arabic Telephone Interpreter # [phone].   Patient sent in message that her BP was 145/102 last night and wanted to know what to do.   Called patient on her mobile number listed, did not reach her. LM for her to call the office   Called husbands mobile number and also LM for her to call the office.   Will reply to My Chart message.

## 2020-02-25 ENCOUNTER — Ambulatory Visit: Payer: Self-pay

## 2020-02-25 ENCOUNTER — Encounter: Payer: BC Managed Care – PPO | Admitting: Family Medicine

## 2020-02-25 NOTE — Lactation Note (Signed)
This note was copied from a baby's chart. Patient Name: Meagan Lee PNPYY'F Date: 02/25/2020 Reason for consult: Follow-up assessment;MD order;NICU baby  Lactation Consultation Note LC to room for bf assistance with mother who bf her other children for 1-2 years each. Mom is comfortable with positioning/latch but baby did not wake with pestering. We discussed developmental readiness and associated cues. Mom has not pumped today. She will resume pumping to ensure sufficient supply. Patient was provided with the opportunity to ask questions. All concerns were addressed.  Will plan follow up visit.   Consult Status Consult Status: Follow-up Follow-up type: In-patient   Elder Negus 02/25/2020, 2:27 PM

## 2020-02-28 ENCOUNTER — Ambulatory Visit (INDEPENDENT_AMBULATORY_CARE_PROVIDER_SITE_OTHER): Payer: BC Managed Care – PPO

## 2020-02-28 ENCOUNTER — Other Ambulatory Visit: Payer: Self-pay

## 2020-02-28 VITALS — BP 127/85 | HR 96 | Wt 169.4 lb

## 2020-02-28 DIAGNOSIS — Z5189 Encounter for other specified aftercare: Secondary | ICD-10-CM

## 2020-02-28 DIAGNOSIS — Z0130 Encounter for examination of blood pressure without abnormal findings: Secondary | ICD-10-CM

## 2020-02-29 NOTE — Progress Notes (Addendum)
Pt here 02/28/20 for BP check and incision check following repeat c-section on 02/19/20; preterm c-section due to preeclampsia in third trimester. Pt taking nifedipine 30 mg daily. BP today is 127/85. Incision is open to air; appears clean, dry, and intact. Encouraged good wound care and reviewed s/s of infection. Patient reports intermittent headache, not relieved with medication; however, headache is relieved by sleep. Denies other s/s of hypertension. Reviewed BP and symptoms with Earlene Plater, MD who recommends pt return in 1 week for BP check and increase sleep as able.  Fleet Contras RN 02/29/20  Attestation of Attending Supervision of RN: Evaluation and management procedures were performed by the nurse under my supervision and collaboration.  I have reviewed the nursing note and chart, and I agree with the management and plan.  Carolyn L. Harraway-Smith, M.D., [place]n Core

## 2020-03-01 ENCOUNTER — Ambulatory Visit: Payer: Self-pay

## 2020-03-01 NOTE — Lactation Note (Signed)
This note was copied from a baby's chart. Lactation Consultation Note LC to room for f/u visit. Mother continues to pump q 3 hours with yield of 3-4 oz per pumping. Mom and baby are breastfeeding for short amounts of time (5 minutes or less). Mom complains of dry, sore nipples and requests coconut oil. LC relayed request to baby's RN. Patient was provided with the opportunity to ask questions. All concerns were addressed. Will plan follow up visit.   Patient Name: Meagan Lee [id number] Date: 03/01/2020 Reason for consult: Follow-up assessment;NICU baby  Consult Status Consult Status: Follow-up Follow-up type: In-patient  Elder Negus, MA IBCLC 03/01/2020, 3:19 PM

## 2020-03-05 ENCOUNTER — Ambulatory Visit: Payer: Self-pay

## 2020-03-05 NOTE — Lactation Note (Signed)
This note was copied from a baby's chart. Lactation Consultation Note  Patient Name: Meagan Lee [id number] Date: 03/05/2020 Reason for consult: Follow-up assessment;NICU baby  LC to room for bf assistance. Baby woke with cues but did not latch. Reviewed with parents that learning to bf is a process and baby's behavior today is normal for her age. Encouraged mother to continue with "lick and learn." Provided additional bottles for pumping. Parents were provided with the opportunity to ask questions. All concerns were addressed. Will plan follow up visit.   Consult Status Consult Status: Follow-up Follow-up type: In-patient  Elder Negus, MA IBCLC 03/05/2020, 5:24 PM

## 2020-03-06 ENCOUNTER — Ambulatory Visit (INDEPENDENT_AMBULATORY_CARE_PROVIDER_SITE_OTHER): Payer: BC Managed Care – PPO | Admitting: General Practice

## 2020-03-06 ENCOUNTER — Other Ambulatory Visit: Payer: Self-pay

## 2020-03-06 VITALS — BP 121/71 | HR 85 | Ht 64.0 in | Wt 169.0 lb

## 2020-03-06 DIAGNOSIS — R519 Headache, unspecified: Secondary | ICD-10-CM

## 2020-03-06 DIAGNOSIS — O9089 Other complications of the puerperium, not elsewhere classified: Secondary | ICD-10-CM

## 2020-03-06 MED ORDER — PROCHLORPERAZINE MALEATE 5 MG PO TABS: 5.0000 mg | ORAL_TABLET | Freq: Four times a day (QID) | ORAL | 0 refills | Status: DC | PRN

## 2020-03-06 NOTE — Progress Notes (Signed)
Patient presents to office today for repeat BP check. Patient delivered via c-section on 12/4. Patient reports daily headaches at night with "pressure in her head". Denies dizziness or blurry vision with headache. No edema noted. Patient is taking Procardia daily in the evening. She is taking her BP at home and reports normal values. She reports trying fiorcet, ibuprofen, & tylenol but none help her headache. BP 121/71 today. Discussed with Dr Mayford Knife who orders compazine to assist with headache relief- patient should stop fiorcet. Reviewed new medication with patient that will hopefully help her headaches and advised she stop taking fiorcet. Also informed patient to contact office if BP is elevated at home or headache worsens. Patient will follow up at Pp visit on 1/17.  Chase Caller RN BSN 03/06/20

## 2020-03-07 ENCOUNTER — Ambulatory Visit: Payer: Self-pay

## 2020-03-07 NOTE — Lactation Note (Signed)
This note was copied from a baby's chart. Lactation Consultation Note  Patient Name: Meagan Lee [id number] Date: 03/07/2020   [facility] spoke with baby's RN and asked to call when Mom arrives to visit baby.   Judee Clara 03/07/2020, 2:57 PM

## 2020-03-09 ENCOUNTER — Ambulatory Visit: Payer: Self-pay

## 2020-03-09 NOTE — Lactation Note (Signed)
This note was copied from a baby's chart. Lactation Consultation Note  Patient Name: Meagan Lee [name] Date: 03/09/2020 Reason for consult: Follow-up assessment;Other (Comment) ([id number] - scheduled NICU appt at 1330 -)  LC met mom in room 301.  Baby awake , rooting, and fussing.  LC offered to assess feeding and mom latched baby.  LC noted to be shallow, and LC had her release suction and start over.  LC reviewed with mom to tap upper lip wait for a wide mouth and latch with breast compression until the baby is swallowing.  Baby sustained a latch with depth for 13 mins  ( Latch Score 9 ) and stayed active until the end when she became non -nutritive and LC recommended mom release the suction.  Nipple well rounded.  Mom mentioned her volume pumping has decreased and LC check the bottles in the refrigerator - 1 -1 1/2 oz EBM bottles noted. Mom mentioned she  Has been pumping 3 -4 times a day.  LC reviewed supply and demand, importance of consistent pumping around the clock both breast at the same time for 15 -20 mins. Hand express after pumping.  LC also recommended while visiting baby after feeding at the breast / plan on pumping.  Due to milk volume being down - add one power pumping 15 - 20 mins on 10 mins off over 60 mins one a day.  Mom mentioned her baby's room got moved and her pump pieces for the DEBP were not moved. Mom only has the tubing. LC provided a DEBP kit with instructions and after feeding mom plans to pump.  Per mom has been using a #27 F and LC recommended to try the #24 F and if comfortable use it, maybe one of the contributing factors for the EBM volume decreasing.     Maternal Data    Feeding- Breast feed fed 13 mins  Feeding Type: Formula  LATCH Score Latch: Grasps breast easily, tongue down, lips flanged, rhythmical sucking.  Audible Swallowing: Spontaneous and intermittent  Type of Nipple: Everted at rest and after stimulation  Comfort  (Breast/Nipple): Soft / non-tender  Hold (Positioning): Assistance needed to correctly position infant at breast and maintain latch.  LATCH Score: 9  Interventions Interventions: Breast feeding basics reviewed;Assisted with latch;Skin to skin;Breast massage;Reverse pressure;Support pillows  Lactation Tools Discussed/Used Tools: Pump Breast pump type: Double-Electric Breast Pump   Consult Status Consult Status: Follow-up Date: 03/10/20 Follow-up type: In-patient    MPajarito Mesa12/23/2021, 2:07 PM

## 2020-03-10 ENCOUNTER — Ambulatory Visit: Payer: Self-pay

## 2020-03-10 NOTE — Lactation Note (Signed)
This note was copied from a baby's chart. Lactation Consultation Note LC to room for scheduled feeding assessment. Mother latched infant independently. LC observed 10+ minutes of rhythmic suckling with audible swallowing on R breast. Infant appeared satisfied p feeding. LC assisted mom with initiating pumping on L breast p bf.   Of concern: Mother admits decreased pumping frequency and decreased output over the past few days. She has a symphony pump at home and has since increased pumping sessions. This morning she pumped 3oz at 5am and 2oz at 8am. She reported "drops" at 10am. At this feeding, there was sufficient supply to satisfy infant. LC encouraged continued pumping q 3 hours when mom is not present to bf infant. Will plan f/u visit and report back to RN.  Patient Name: Meagan Lee [id number] Date: 03/10/2020 Reason for consult: Follow-up assessment;NICU baby Age:34 wk.o.  LATCH Score Latch: Grasps breast easily, tongue down, lips flanged, rhythmical sucking.  Audible Swallowing: Spontaneous and intermittent  Type of Nipple: Everted at rest and after stimulation  Comfort (Breast/Nipple): Soft / non-tender  Hold (Positioning): No assistance needed to correctly position infant at breast.  LATCH Score: 10  Consult Status Consult Status: Follow-up Date: 03/11/20 Follow-up type: In-patient  Elder Negus, MA IBCLC 03/10/2020, 2:36 PM

## 2020-03-12 ENCOUNTER — Inpatient Hospital Stay (HOSPITAL_COMMUNITY)
Admission: RE | Admit: 2020-03-12 | Payer: BC Managed Care – PPO | Source: Home / Self Care | Admitting: Obstetrics and Gynecology

## 2020-03-12 ENCOUNTER — Encounter (HOSPITAL_COMMUNITY): Admission: RE | Payer: Self-pay | Source: Home / Self Care

## 2020-03-12 SURGERY — Surgical Case
Anesthesia: Regional

## 2020-03-15 ENCOUNTER — Ambulatory Visit: Payer: Self-pay

## 2020-03-15 NOTE — Lactation Note (Signed)
This note was copied from a baby's chart. Lactation Consultation Note  Patient Name: Meagan Lee Date: 03/15/2020 Reason for consult: Follow-up assessment (RN request) Age:34 wk.o.   RN requested lactation to report to 1100 feeding with SLP. Parents not present at this visit upon arrival (they were scheduled to be in at this time). SLP provided history and background related to parental breast feedign goals and baby's feeding progress. She was bottle feeding baby in side-lying upon entry.  Per SLP, parents desire to EBF. One option is to remove the feeding tube device and have Ms. Thorns breast feed exclusively for 48 hours. Next steps will be determined based on baby's progress and weight.    Lactation to follow up on this plan and to offer assistance on 12/30. Lactation to offer additional education and support to parents, and will discuss indications for milk expression.  Consult Status Consult Status: Follow-up Date: 03/16/20 Follow-up type: In-patient    Walker Shadow 03/15/2020, 11:44 AM

## 2020-03-15 NOTE — Lactation Note (Addendum)
This note was copied from a baby's chart. Lactation Consultation Note  Patient Name: Meagan Lee [id number] Date: 03/15/2020 Reason for consult: Follow-up assessment Age:34 wk.o.  1404 - 1430 - I reported to [name]'s room to participate in conversation with SLP and NP regarding immediate feeding plan. Ipad interpretor: Meagan Lee. [name] states that she's prepared to room in and exclusively breast feed baby for this trial period. I recommended breast feeding on demand 8-12 times a day, and I recommended that she offer both breasts in a feeding. I suggested that she monitor for hunger cues and signs of good rhythmic suckling (reviewed both).   [name] was holding baby at the breast, and baby appeared to be sleeping. [name] states that baby was actively suckling for about 14 minutes prior to my entry.  We discussed a trial period of exclusive breast feeding (24-48 hours) with weight gain monitoring and signs of satiation. Baby's feeding plan will be adapted as needed. Lactation to follow up to monitor progress.  Feeding Feeding Type: Breast Fed   Interventions Interventions: Breast feeding basics reviewed  Lactation Tools Discussed/Used     Consult Status Consult Status: Follow-up Date: 03/16/20 Follow-up type: In-patient    Meagan Lee 03/15/2020, 3:14 PM

## 2020-03-16 ENCOUNTER — Ambulatory Visit: Payer: Self-pay

## 2020-03-16 NOTE — Lactation Note (Signed)
This note was copied from a baby's chart. Lactation Consultation Note  Patient Name: Girl Kandice Schmelter [id number] Date: 03/16/2020 Reason for consult: Follow-up assessment;Mother's request;NICU baby;Preterm <34wks Age:34 wk.o.  1124 - 1147 - (Per RN report prior to my entry into the room, I learned that baby had two brady events in the last 24 hours and has lost weight. There was one longer interval where baby would not wake up to eat. This was a 24 hour breast feeding only trial. Baby did receive one bottle in the early am).  Lactation reported to Ms. Azam's room upon her request. She was holding baby in her arms on the left breast, and baby appeared to be sleepy. She states that she fed for about 18 minutes around 1100. Ms. Quaranta shared her feeding record for the past 24 hours which indicates 8 feedings and one long interval of 4+ hours between a feeding.  She states that some of the feedings only last about 10 minutes, and this concerns her. We discussed baby's energy at the breast, and I recommended that she supplement post breast feedings at this time to ensure that baby is achieving adequate calories. She was agreeable to this plan, and she indicated that she was okay with providing formula.   She prefers to breast feed exclusively. I encouraged her to continue to put baby to breast while visiting, and we discussed why supplementation may be helpful at this time. We discussed how baby's plan could be modified based on her progress.  Ms. Gilkison states that she tried following the pumping regimen provided by a previous lactation consultant, but that she felt like she was not able to express much milk. She was hesitant to continue to pump. I recommended that she pump while at home and separated at baby, and I also stated the importance of post-pumping after breast feeding to provide additional stimulation of the breasts. She verbalized understanding.   Ms. Kerchner states that after staying overnight  last night, she would like to go home and rest. She is okay with baby receiving a bottle while she is home, and she agreed to pump while at home. She states that she has a better understanding of the situation after staying last night. She reports that she has felt more emotional with this baby, and I tried to reassure her that these feelings were normal, and we were all her to support her and her baby. She thanked me.  I encouraged her to call lactation for follow up, and she verbalized understanding.   Maternal Data Has patient been taught Hand Expression?: Yes Does the patient have breastfeeding experience prior to this delivery?: Yes  Feeding Feeding Type: Breast Fed  LATCH Score Latch: Too sleepy or reluctant, no latch achieved, no sucking elicited.  Audible Swallowing: None (baby had breast fed prior to entry)  Type of Nipple: Everted at rest and after stimulation  Comfort (Breast/Nipple): Soft / non-tender  Hold (Positioning): No assistance needed to correctly position infant at breast.  LATCH Score: 10  Interventions Interventions: Breast feeding basics reviewed;Skin to skin;Hand express;DEBP  Lactation Tools Discussed/Used Pump Education: Setup, frequency, and cleaning   Consult Status Consult Status: Follow-up Date: 03/20/20 Follow-up type: In-patient    Walker Shadow 03/16/2020, 12:11 PM

## 2020-04-03 ENCOUNTER — Ambulatory Visit: Payer: BC Managed Care – PPO | Admitting: Family Medicine

## 2020-04-14 ENCOUNTER — Ambulatory Visit (INDEPENDENT_AMBULATORY_CARE_PROVIDER_SITE_OTHER): Payer: BC Managed Care – PPO | Admitting: Certified Nurse Midwife

## 2020-04-14 ENCOUNTER — Other Ambulatory Visit: Payer: Self-pay

## 2020-04-14 ENCOUNTER — Encounter: Payer: Self-pay | Admitting: Certified Nurse Midwife

## 2020-04-14 DIAGNOSIS — O99893 Other specified diseases and conditions complicating puerperium: Secondary | ICD-10-CM

## 2020-04-14 DIAGNOSIS — M79605 Pain in left leg: Secondary | ICD-10-CM

## 2020-04-14 MED ORDER — CLASSIC PRENATAL 28-0.8 MG PO TABS: 1.0000 | ORAL_TABLET | Freq: Every day | ORAL | 5 refills | Status: DC

## 2020-04-14 NOTE — Progress Notes (Signed)
Post Partum Visit Note  Meagan Lee is a 35 y.o. [id number] female who presents for a postpartum visit. She is 7 weeks postpartum following a repeat cesarean section.  I have fully reviewed the prenatal and intrapartum course. The delivery was at 33/6 gestational weeks.  Anesthesia: spinal. Postpartum course has been uncomplicated. Baby is doing well. Baby is feeding by both breast and bottle - Carn[place] Good Start. Bleeding no bleeding. Bowel function is normal. Bladder function is normal. Patient is not sexually active. Contraception method is none. Postpartum depression screening: negative.   The pregnancy intention screening data noted above was reviewed. Potential methods of contraception were discussed. The patient elected to proceed with IUD or IUS, wants to be rescheduled for two weeks from now for placement. Will abstain from IC until that time.  Edinburgh Postnatal Depression Scale - 04/14/20 1055      Edinburgh Postnatal Depression Scale:  In the Past 7 Days   I have been able to laugh and see the funny side of things. 0    I have looked forward with enjoyment to things. 0    I have blamed myself unnecessarily when things went wrong. 0    I have been anxious or worried for no good reason. 0    I have felt scared or panicky for no good reason. 0    Things have been getting on top of me. 0    I have been so unhappy that I have had difficulty sleeping. 0    I have felt sad or miserable. 0    I have been so unhappy that I have been crying. 0    The thought of harming myself has occurred to me. 0    Edinburgh Postnatal Depression Scale Total 0          The following portions of the patient's history were reviewed and updated as appropriate: allergies, current medications, past family history, past medical history, past social history, past surgical history and problem list.  Review of Systems Pertinent items noted in HPI and remainder of comprehensive ROS otherwise negative.     Objective:  BP (!) 117/59   Pulse 92   Ht 5\' 4"  (1.626 m)   Wt 176 lb 3.2 oz (79.9 kg)   BMI 30.24 kg/m    General:  alert, cooperative, appears stated age and no distress   Breasts:  negative  Lungs: clear to auscultation bilaterally  Heart:  regular rate and rhythm  Abdomen: soft, non-tender; bowel sounds normal; no masses,  no organomegaly   Vulva:  not evaluated  Vagina: not evaluated  Cervix:  not evaluated  Corpus: normal - no diastasis recti noted  Adnexa:  not evaluated  Rectal Exam: Not performed.        Assessment:    Normal postpartum exam. Pap smear not done at today's visit (not due until next year).  Plan:   Essential components of care per ACOG recommendations:  1.  Mood and well being: Patient with negative depression screening today. Reviewed local resources for support.  - Patient does not use tobacco.  - hx of drug use? No    2. Infant care and feeding:  -Patient currently breastmilk feeding? Yes  - Reviewed importance of draining breast regularly to support lactation. -Social determinants of health (SDOH) reviewed in EPIC. No concerns  3. Sexuality, contraception and birth spacing - Patient does not want a pregnancy in the next year.  Desired family size is 3 children.  -  Reviewed forms of contraception in tiered fashion. Patient desires IUD, will come back for placement in two weeks.  4. Sleep and fatigue - Sleeping well, family is very supportive and engaged in infant care  5. Physical Recovery  - Discussed patients delivery and complications - Patient had a Cesarean, expectations for healing reviewed. Patient expressed understanding - Patient has urinary incontinence? No  - Patient was referred to pelvic floor PT for ongoing left leg pain that presented immediately after delivery and has begun to resolve  - Patient is safe to resume physical and sexual activity  6.  Health Maintenance - Last pap smear done 2020 and was normal with  negative HPV. - Mammogram not indicated  7. No Chronic Disease - PCP follow up as needed  Edd Arbour, CNM, MSN, IBCLC Certified Nurse Midwife, [facility]

## 2020-04-14 NOTE — Progress Notes (Signed)
Pt interested in IUD but wants to sched. For another date.

## 2020-05-03 ENCOUNTER — Other Ambulatory Visit: Payer: Self-pay

## 2020-05-03 ENCOUNTER — Ambulatory Visit (INDEPENDENT_AMBULATORY_CARE_PROVIDER_SITE_OTHER): Payer: BC Managed Care – PPO | Admitting: Nurse Practitioner

## 2020-05-03 ENCOUNTER — Encounter: Payer: Self-pay | Admitting: Nurse Practitioner

## 2020-05-03 DIAGNOSIS — Z975 Presence of (intrauterine) contraceptive device: Secondary | ICD-10-CM | POA: Insufficient documentation

## 2020-05-03 DIAGNOSIS — Z30430 Encounter for insertion of intrauterine contraceptive device: Secondary | ICD-10-CM | POA: Diagnosis not present

## 2020-05-03 DIAGNOSIS — Z3202 Encounter for pregnancy test, result negative: Secondary | ICD-10-CM | POA: Diagnosis not present

## 2020-05-03 LAB — POCT PREGNANCY, URINE: Preg Test, Ur: NEGATIVE

## 2020-05-03 MED ORDER — PARAGARD INTRAUTERINE COPPER IU IUD
INTRAUTERINE_SYSTEM | Freq: Once | INTRAUTERINE | Status: AC
Start: 2020-05-03 — End: 2020-05-03
  Administered 2020-05-03: 1 via INTRAUTERINE

## 2020-05-03 NOTE — Progress Notes (Signed)
Meagan Lee 35 y.o. Had PP visit 2 weeks ago and is here today for IUD insertion.  Has had 2 other IUDs inserted out of the [place].  Wants a nonhormonal method- desires Paragard despite usual menses of 8 days.  Discussed the ability to shorten the menses with a hormonal IUD but client wants only a nonhormonal method.     IUD Insertion Procedure Note Patient identified, informed consent performed, consent signed.   Discussed risks of irregular bleeding, cramping, infection, malpositioning or misplacement of the IUD outside the uterus which may require further procedure such as laparoscopy. Time out was performed.  Urine pregnancy test negative.  Speculum placed in the vagina.  Cervix visualized.  Cleaned with Betadine x 2.  Hurricane spray used.  Grasped posteriorly with a single tooth tenaculum.  Uterus sounded to 8cm.  paragard IUD placed per manufacturer's recommendations.  Strings trimmed to 3 cm. Tenaculum was removed, good hemostasis noted.  Patient tolerated procedure well.   Patient was given post-procedure instructions.  She was advised to have backup contraception for one week.  Patient was also asked to check IUD strings periodically and follow up in 4 weeks for IUD check.   Nolene Bernheim, RN, MSN, NP-BC Nurse Practitioner, [facility] for [employer], [facility] 05/03/2020 7:56 PM

## 2020-05-29 ENCOUNTER — Ambulatory Visit: Payer: BC Managed Care – PPO | Admitting: Nurse Practitioner

## 2020-06-15 ENCOUNTER — Other Ambulatory Visit: Payer: Self-pay

## 2020-06-15 ENCOUNTER — Ambulatory Visit (INDEPENDENT_AMBULATORY_CARE_PROVIDER_SITE_OTHER): Payer: BC Managed Care – PPO | Admitting: Nurse Practitioner

## 2020-06-15 ENCOUNTER — Encounter: Payer: Self-pay | Admitting: Nurse Practitioner

## 2020-06-15 VITALS — BP 117/71 | HR 78 | Ht 64.0 in | Wt 190.7 lb

## 2020-06-15 DIAGNOSIS — Z30431 Encounter for routine checking of intrauterine contraceptive device: Secondary | ICD-10-CM | POA: Diagnosis not present

## 2020-06-15 NOTE — Progress Notes (Signed)
    GYNECOLOGY OFFICE ENCOUNTER NOTE  History:  35 y.o. [id number] here today for today for IUD string check; Paragard  IUD was placed  04-2020. No complaints about the IUD, no concerning side effects.  The following portions of the patient's history were reviewed and updated as appropriate: allergies, current medications, past family history, past medical history, past social history, past surgical history and problem list. Last pap smear on 01-11-19 was normal, negative HRHPV.  Review of Systems:  Pertinent items are noted in HPI.   Objective:  Physical Exam Blood pressure 117/71, pulse 78, height 5\' 4"  (1.626 m), weight 190 lb 11.2 oz (86.5 kg), currently breastfeeding. CONSTITUTIONAL: Well-developed, well-nourished female in no acute distress.  HENT:  Normocephalic, atraumatic. External right and left ear normal. Oropharynx is clear and moist EYES: Conjunctivae and EOM are normal. Pupils are equal, round, and reactive to light. No scleral icterus.  NECK: Normal range of motion, supple, no masses CARDIOVASCULAR: Normal heart rate noted RESPIRATORY: Effort and breath sounds normal, no problems with respiration noted ABDOMEN: Soft, no distention noted.   PELVIC: Normal appearing external genitalia; normal appearing vaginal mucosa and cervix.  IUD strings NOT visualized but palpated.  Client reports no problems with menses.  Patient needs Peterson speculum for comfort.  Assessment & Plan:  IUD Check Unable to visualize strings - client was uncomfortable with exam Has not had BM in 2 days - would likely have easier visibility if having regular BM.  Asked patient to come back in one week after having BM for visualization of strings - she decline.  Strings palpated on Bimanual exam.  Patient to keep IUD in place for up to 10 years; can come in for removal if she desires pregnancy earlier or for or concerning side effects. Return in one year for annual exam.

## 2020-07-03 ENCOUNTER — Other Ambulatory Visit: Payer: Self-pay

## 2020-07-03 ENCOUNTER — Ambulatory Visit: Payer: BC Managed Care – PPO | Attending: Certified Nurse Midwife | Admitting: Physical Therapy

## 2020-07-03 ENCOUNTER — Encounter: Payer: Self-pay | Admitting: Physical Therapy

## 2020-07-03 DIAGNOSIS — M79605 Pain in left leg: Secondary | ICD-10-CM | POA: Insufficient documentation

## 2020-07-03 DIAGNOSIS — M6281 Muscle weakness (generalized): Secondary | ICD-10-CM | POA: Insufficient documentation

## 2020-07-03 NOTE — Therapy (Signed)
[facility] [address], [address] [place], [place], [address] Phone: [phone]   Fax:  [phone]  Physical Therapy Evaluation  Patient Details  Name: Meagan Lee MRN: [id number] Date of Birth: 1985/08/08 Referring Provider (PT): Bernerd Limbo, [place]   Encounter Date: 07/03/2020   PT End of Session - 07/03/20 1124    Visit Number 1    Date for PT Re-Evaluation 09/25/20    Authorization Type [place] MEDICAID PREPAID HEALTH PLAN    PT Start Time 1118   late   PT Stop Time 1145    PT Time Calculation (min) 27 min    Activity Tolerance Patient tolerated treatment well    Behavior During Therapy [facility] for tasks assessed/performed           Past Medical History:  Diagnosis Date  . Hypertension   . New daily persistent headache 07/07/2019  . Other fatigue 07/07/2019  . Preeclampsia, severe 02/17/2020  . Preeclampsia, third trimester 02/14/2020   Dx 02/14/20 @33  wks  . Pregnancy induced hypertension   . Supervision of high risk pregnancy, antepartum 12/08/2019    Nursing Staff Provider Office Location  [facility] Dating  LMP  Language  Arabic Anatomy [facility]  Normal> EDC change. FU in 4 weeks to confirm new due date.  Flu Vaccine  declined Genetic Screen  NIPS: Low Risk, female    AFP:  Too late   TDaP Vaccine   12/23/19 Hgb A1C or  GTT Early  Third trimester Normal   Glucose, Fasting 65 - 91 mg/dL 77   Glucose, 1 hour 65 - 179 mg/dL 02/22/20   Glucose, 2 hour 65 - 152    Past Surgical History:  Procedure Laterality Date  . CESAREAN SECTION     x2  . CESAREAN SECTION N/A 02/19/2020   Procedure: CESAREAN SECTION;  Surgeon: 14/06/2019, MD;  Location: [facility] LD ORS;  Service: Obstetrics;  Laterality: N/A;    There were no vitals filed for this visit.    Subjective Assessment - 07/03/20 1117    Subjective Pt states January to March was the worst with pain. Currently more numbness than pain.    Pertinent History c-secion x 2,    How long  can you walk comfortably? 2 minutes    Patient Stated Goals reduce pain    Currently in Pain? Yes    Pain Score 8     Pain Location Leg    Pain Orientation Left;Right;Anterior    Pain Descriptors / Indicators Pressure    Pain Type Post Delivery (post partum)    Pain Radiating Towards abdomen to mid thigh (pain), numbness front of Lt leg    Pain Onset More than a month ago    Pain Frequency Intermittent    Aggravating Factors  walking    Pain Relieving Factors lying down    Effect of Pain on Daily Activities can't move during the day    Multiple Pain Sites No              [facility] PT Assessment - 07/03/20 0001      Assessment   Medical Diagnosis M79.605 (ICD-10-CM) - Left leg pain    Referring Provider (PT) 07/05/20, CNM    Onset Date/Surgical Date --   02/19/20   Prior Therapy No      Precautions   Precautions None      Restrictions   Weight Bearing Restrictions No      Balance Screen  Has the patient fallen in the past 6 months No      Home Nurse, mental health Private residence    Living Arrangements Spouse/significant other;Children   2     Prior Function   Level of Independence Independent    Vocation Unemployed      Cognition   Overall Cognitive Status Within Functional Limits for tasks assessed      Posture/Postural Control   Posture/Postural Control Postural limitations    Postural Limitations Anterior pelvic tilt;Increased lumbar lordosis      ROM / Strength   AROM / PROM / Strength Strength;PROM      PROM   Overall PROM Comments normal bil hip      Strength   Overall Strength Comments Lt 4-/5 hip strength      Flexibility   Soft Tissue Assessment /Muscle Length yes    Hamstrings Lt 80%; Rt 90%      Palpation   Palpation comment scar tissue lower abdomen tight and Lt side TTP; Lt inguinal ligament TTP      Special Tests    Special Tests Lumbar    Lumbar Tests Straight Leg Raise      Straight Leg Raise   Findings  Negative      Ambulation/Gait   Gait Pattern Decreased stride length;Decreased stance time - left                      Objective measurements completed on examination: See above findings.     Pelvic Floor Special Questions - 07/03/20 0001    Prior Pelvic/Prostate Exam Yes    Are you Pregnant or attempting pregnancy? No    Prior Pregnancies Yes    Number of Pregnancies 2   +1 miscarriage   Number of C-Sections 2    Currently Sexually Active Yes    Is this Painful No    Urinary Leakage No    Urinary urgency No    Urinary frequency normal    Fecal incontinence No    Falling out feeling (prolapse) No    Exam Type Deferred                      PT Short Term Goals - 07/03/20 1623      PT SHORT TERM GOAL #1   Title Pt will be ind with initial HEP    Time 4    Period Weeks    Status New    Target Date 07/31/20             PT Long Term Goals - 07/03/20 1620      PT LONG TERM GOAL #1   Title Pt will be ind with final HEP to maintain strength for functional activities and management of pain    Time 12    Period Weeks    Status New    Target Date 09/25/20      PT LONG TERM GOAL #2   Title Pt will report she can stand and walk for at least 60 minutes at a time for performing normal household activities.    Time 12    Period Weeks    Status New    Target Date 09/25/20      PT LONG TERM GOAL #3   Title Pt will demonstrate 5/5 hip strength for ability to lift things for household chores and walking    Time 12    Period Weeks    Status  New    Target Date 09/25/20      PT LONG TERM GOAL #4   Title Pt will report at least 60% less pain and numbness in her legs    Time 12    Period Weeks    Status New    Target Date 09/25/20                  Plan - 07/03/20 1610    Clinical Impression Statement Pt presents to skilled PT due to pain since c-section delivery December 2021.  Pt has pain in abdomen and into both LE.  Pt states pain  has gotten a little better recently but is now having more numbness in the Lt LE.  Pt has pain that is brought on with palpation of the Lt c-section incision and Lt inguinal ligament.  C-section scar is tense and increased fascial restrictions throughout.  Pt has LE weakness as mentioned above. and posture abnormalities.  Pt will benefit from skilled PT to address muscle imbalances, tension, and fascial restrictions and educate on pain management as well as addressing all above mentioned impairments for full return to funnctional activities.    Stability/Clinical Decision Making Evolving/Moderate complexity    Clinical Decision Making Moderate    Rehab Potential Good    PT Frequency 1x / week    PT Duration 12 weeks    PT Treatment/Interventions ADLs/Self Care Home Management;Biofeedback;Cryotherapy;Electrical Stimulation;Moist Heat;Therapeutic activities;Therapeutic exercise;Neuromuscular re-education;Patient/family education;Manual techniques;Dry needling;Passive range of motion;Taping    PT Next Visit Plan scar and abdominal massage; assess pelvic floor externally, core and hip strength    Consulted and Agree with Plan of Care Patient           Patient will benefit from skilled therapeutic intervention in order to improve the following deficits and impairments:  Decreased strength,Decreased coordination,Pain,Postural dysfunction,Abnormal gait  Visit Diagnosis: Muscle weakness (generalized)  Pain in left leg     Problem List Patient Active Problem List   Diagnosis Date Noted  . Presence of IUD 05/03/2020  . Vitamin D deficiency 07/07/2019  . Prior c-section x 2  01/11/2019    Junious Silk, PT 07/03/2020, 5:32 PM  [facility] [address] [place], [place], [address] Phone: [phone]   Fax:  [phone]  Name: Meagan Lee MRN: [id number] Date of Birth: 02-17-1986

## 2020-07-14 ENCOUNTER — Ambulatory Visit: Payer: BC Managed Care – PPO | Admitting: Physical Therapy

## 2020-08-17 ENCOUNTER — Ambulatory Visit: Payer: BC Managed Care – PPO | Attending: Certified Nurse Midwife | Admitting: Physical Therapy

## 2020-08-17 ENCOUNTER — Other Ambulatory Visit: Payer: Self-pay

## 2020-08-17 ENCOUNTER — Encounter: Payer: Self-pay | Admitting: Physical Therapy

## 2020-08-17 DIAGNOSIS — M6281 Muscle weakness (generalized): Secondary | ICD-10-CM | POA: Diagnosis not present

## 2020-08-17 DIAGNOSIS — M79605 Pain in left leg: Secondary | ICD-10-CM | POA: Diagnosis present

## 2020-08-17 NOTE — Therapy (Signed)
[facility] [address] [place], [place], [address] Phone: [phone]   Fax:  [phone]  Physical Therapy Treatment  Patient Details  Name: Meagan Lee MRN: [id number] Date of Birth: May 20, 1985 Referring Provider (PT): Gabriel Carina, [place]   Encounter Date: 08/17/2020   PT End of Session - 08/17/20 1012    Visit Number 2    Date for PT Re-Evaluation 09/25/20    Authorization Type [place] MEDICAID PREPAID HEALTH PLAN    PT Start Time 1012    PT Stop Time 1051    PT Time Calculation (min) 39 min    Activity Tolerance Patient tolerated treatment well    Behavior During Therapy [facility] for tasks assessed/performed           Past Medical History:  Diagnosis Date  . Hypertension   . New daily persistent headache 07/07/2019  . Other fatigue 07/07/2019  . Preeclampsia, severe 02/17/2020  . Preeclampsia, third trimester 02/14/2020   Dx 02/14/20 '@33'  wks  . Pregnancy induced hypertension   . Supervision of high risk pregnancy, antepartum 12/08/2019    Nursing Staff Provider Office Location  [place] Dating  LMP  Language  Arabic Anatomy US  Normal> EDC change. FU in 4 weeks to confirm new due date.  Flu Vaccine  declined Genetic Screen  NIPS: Low Risk, female    AFP:  Too late   TDaP Vaccine   12/23/19 Hgb A1C or  GTT Early  Third trimester Normal   Glucose, Fasting 65 - 91 mg/dL 77   Glucose, 1 hour 65 - 179 mg/dL 171   Glucose, 2 hour 65 - 152    Past Surgical History:  Procedure Laterality Date  . CESAREAN SECTION     x2  . CESAREAN SECTION N/A 02/19/2020   Procedure: CESAREAN SECTION;  Surgeon: Aletha Halim, MD;  Location: [facility] LD [facility]S;  Service: Obstetrics;  Laterality: N/A;    There were no vitals filed for this visit.   Subjective Assessment - 08/17/20 1015    Subjective Pt states she cannot move after 5 min of washing dishes or cleaning.  Then I have to lay down for an hour.    Patient Stated Goals reduce pain     Currently in Pain? Yes    Pain Score 8    gets down to 1-2/10   Pain Location Back    Pain Orientation Right;Left;Lower    Pain Descriptors / Indicators Pressure    Pain Type Post Delivery (post partum)    Pain Radiating Towards upper thigh aneriorly    Pain Onset More than a month ago    Pain Frequency Intermittent    Aggravating Factors  moving, cleaning    Pain Relieving Factors lying down    Multiple Pain Sites No                             [facility] Adult PT Treatment/Exercise - 08/17/20 0001      Exercises   Exercises Lumbar      Lumbar Exercises: Stretches   Active Hamstring Stretch Right;Left;30 seconds    Double Knee to Chest Stretch Limitations happy baby    Lower Trunk Rotation 5 reps;10 seconds    Figure 4 Stretch 2 reps;30 seconds      Lumbar Exercises: Supine   AB Set Limitations press into thighs    Heel Slides 20 reps    Other Supine Lumbar  Exercises bent knee fallout - 15x      Manual Therapy   Manual Therapy Myofascial release    Myofascial Release scar tissue and lumbar fascial release with sacral distraction                    PT Short Term Goals - 08/17/20 1142      PT SHORT TERM GOAL #1   Title Pt will be ind with initial HEP    Baseline gave it to her today    Status On-going             PT Long Term Goals - 07/03/20 1620      PT LONG TERM GOAL #1   Title Pt will be ind with final HEP to maintain strength for functional activities and management of pain    Time 12    Period Weeks    Status New    Target Date 09/25/20      PT LONG TERM GOAL #2   Title Pt will report she can stand and walk for at least 60 minutes at a time for performing normal household activities.    Time 12    Period Weeks    Status New    Target Date 09/25/20      PT LONG TERM GOAL #3   Title Pt will demonstrate 5/5 hip strength for ability to lift things for household chores and walking    Time 12    Period Weeks    Status New     Target Date 09/25/20      PT LONG TERM GOAL #4   Title Pt will report at least 60% less pain and numbness in her legs    Time 12    Period Weeks    Status New    Target Date 09/25/20                 Plan - 08/17/20 1054    Clinical Impression Statement Pt did well with exercises and reports feeling better afterwards.  Pt educated on doing these when she has pain to assess what is making it better.  Pt has increased lumbar lordosis and diminished ability to engage the core and she did well with basic exercise added to HEP to work on improved posture and function.  No goals met yet due to initial f/u since eval.    PT Treatment/Interventions ADLs/Self Care Home Management;Biofeedback;Cryotherapy;Electrical Stimulation;Moist Heat;Therapeutic activities;Therapeutic exercise;Neuromuscular re-education;Patient/family education;Manual techniques;Dry needling;Passive range of motion;Taping    PT Next Visit Plan f/u on stretches; progress core ex's as able; f/u externally assessment of pelvic floor if needed    Consulted and Agree with Plan of Care Patient           Patient will benefit from skilled therapeutic intervention in order to improve the following deficits and impairments:  Decreased strength,Decreased coordination,Pain,Postural dysfunction,Abnormal gait  Visit Diagnosis: Muscle weakness (generalized)  Pain in left leg     Problem List Patient Active Problem List   Diagnosis Date Noted  . Presence of IUD 05/03/2020  . Vitamin D deficiency 07/07/2019  . Prior c-section x 2  01/11/2019    Jule Ser, PT 08/17/2020, 11:42 AM  [facility] [address] [place], [place], [address] Phone: [phone]   Fax:  [phone]  Name: Meagan Lee MRN: [id number] Date of Birth: [birth year]

## 2020-08-24 ENCOUNTER — Other Ambulatory Visit: Payer: Self-pay

## 2020-08-24 ENCOUNTER — Ambulatory Visit: Payer: BC Managed Care – PPO | Admitting: Physical Therapy

## 2020-08-24 ENCOUNTER — Encounter: Payer: Self-pay | Admitting: Physical Therapy

## 2020-08-24 DIAGNOSIS — M6281 Muscle weakness (generalized): Secondary | ICD-10-CM | POA: Diagnosis not present

## 2020-08-24 DIAGNOSIS — M79605 Pain in left leg: Secondary | ICD-10-CM

## 2020-08-24 NOTE — Patient Instructions (Signed)
Access Code: [email or web address] URL: [email or web address] Date: 08/24/2020 Prepared by: Dwana Curd  Exercises Supine Pelvic Floor Stretch - 1 x daily - 7 x weekly - 1 sets - 3 reps - 30 sec hold Supine Hamstring Stretch - 1 x daily - 7 x weekly - 1 sets - 3 reps - 30 sec hold Supine Piriformis Stretch - 1 x daily - 7 x weekly - 1 sets - 3 reps - 30 sec hold Supine Lower Trunk Rotation - 1 x daily - 7 x weekly - 10 reps - 1 sets - 5 sec hold Supine Transversus Abdominis Bracing - Hands on Thighs - 1 x daily - 7 x weekly - 10 reps - 1 sets - 5 sec hold

## 2020-08-24 NOTE — Therapy (Signed)
[facility] [address] [place], [place], [address] Phone: [phone]   Fax:  [phone]  Physical Therapy Treatment  Patient Details  Name: Meagan Lee MRN: [id number] Date of Birth: [birth year] Referring Provider (PT): Bernerd Limbo, [place]   Encounter Date: 08/24/2020   PT End of Session - 08/24/20 1157     Visit Number 3    Date for PT Re-Evaluation 09/25/20    Authorization Type [place] MEDICAID PREPAID HEALTH PLAN - 4 visits until 6/18    PT Start Time 1145    PT Stop Time 1225    PT Time Calculation (min) 40 min    Activity Tolerance Patient tolerated treatment well    Behavior During Therapy [facility] for tasks assessed/performed             Past Medical History:  Diagnosis Date   Hypertension    New daily persistent headache 07/07/2019   Other fatigue 07/07/2019   Preeclampsia, severe 02/17/2020   Preeclampsia, third trimester 02/14/2020   Dx 02/14/20 @33  wks   Pregnancy induced hypertension    Supervision of high risk pregnancy, antepartum 12/08/2019    Nursing Staff Provider Office Location  [facility] Dating  LMP  Language  Arabic Anatomy US  Normal> EDC change. FU in 4 weeks to confirm new due date.  Flu Vaccine  declined Genetic Screen  NIPS: Low Risk, female    AFP:  Too late   TDaP Vaccine   12/23/19 Hgb A1C or  GTT Early  Third trimester Normal   Glucose, Fasting 65 - 91 mg/dL 77   Glucose, 1 hour 65 - 179 mg/dL 528   Glucose, 2 hour 65 - 152    Past Surgical History:  Procedure Laterality Date   CESAREAN SECTION     x2   CESAREAN SECTION N/A 02/19/2020   Procedure: CESAREAN SECTION;  Surgeon: Ivanhoe Bing, MD;  [facility]: [facility] LD [facility];  Service: Obstetrics;  Laterality: N/A;    There were no vitals filed for this visit.   Subjective Assessment - 08/24/20 1148     Subjective I feel better and have about 6/10 after washing dishes.  Normally 6/10 but more when bending fowards.    Pertinent History  c-secion x 2,    How long can you walk comfortably? 2 minutes    Patient Stated Goals reduce pain    Currently in Pain? Yes    Pain Score 6     Pain Location Back    Pain Orientation Right;Left;Lower    Pain Descriptors / Indicators Pressure    Pain Type Post Delivery (post partum)    Pain Onset More than a month ago    Pain Frequency Intermittent    Multiple Pain Sites No                               [facility] Adult PT Treatment/Exercise - 08/24/20 0001       Self-Care   Self-Care Other Self-Care Comments    Other Self-Care Comments  tennis ball to gluteals      Lumbar Exercises: Standing   Wall Slides 15 reps    Wall Slides Limitations with post pelvic tilt      Lumbar Exercises: Supine   Clam 20 reps    Clam Limitations red    Bridge Limitations attemtp 2 x with increased pain    Other Supine Lumbar Exercises bent knee raises  20x      Lumbar Exercises: Quadruped   Other Quadruped Lumbar Exercises child pose with sidebending      Manual Therapy   Manual therapy comments TTP Lt SIjoint; compression felt better but then caused numbness in leg; STM to gltueals Lt side                    PT Education - 08/24/20 1156     Education Details Access Code: [email or web address]    Person(s) Educated Patient    Methods Explanation;Demonstration;Tactile cues;Verbal cues;Handout    Comprehension Verbalized understanding;Returned demonstration              PT Short Term Goals - 08/24/20 1159       PT SHORT TERM GOAL #1   Title Pt will be ind with initial HEP    Status Achieved               PT Long Term Goals - 08/24/20 1203       PT LONG TERM GOAL #1   Title Pt will be ind with final HEP to maintain strength for functional activities and management of pain    Status On-going      PT LONG TERM GOAL #2   Title Pt will report she can stand and walk for at least 60 minutes at a time for performing normal household activities.    Status  On-going      PT LONG TERM GOAL #3   Title Pt will demonstrate 5/5 hip strength for ability to lift things for household chores and walking    Status On-going      PT LONG TERM GOAL #4   Title Pt will report at least 60% less pain and numbness in her legs    Baseline I have strong numbness at night sleeping for one hour    Status On-going                   Plan - 08/24/20 1231     Clinical Impression Statement Pt was able to progress strength.  She is overall doing much better with her household activities. Sleeping is still causing pain and numbness.  Pt is still only able to do moderate to light core strength.  She will benefit from skilled PT to progress core strength for return to maximum function.    PT Treatment/Interventions ADLs/Self Care Home Management;Biofeedback;Cryotherapy;Electrical Stimulation;Moist Heat;Therapeutic activities;Therapeutic exercise;Neuromuscular re-education;Patient/family education;Manual techniques;Dry needling;Passive range of motion;Taping    PT Next Visit Plan progress core strength and gluteal ex's; lifting technique; re-assess    PT Home Exercise Plan Access Code: [email or web address]    Consulted and Agree with Plan of Care Patient             Patient will benefit from skilled therapeutic intervention in order to improve the following deficits and impairments:  Decreased strength, Decreased coordination, Pain, Postural dysfunction, Abnormal gait  Visit Diagnosis: Muscle weakness (generalized)  Pain in left leg     Problem List Patient Active Problem List   Diagnosis Date Noted   Presence of IUD 05/03/2020   Vitamin D deficiency 07/07/2019   Prior c-section x 2  01/11/2019    Brayton Caves Joeanne Robicheaux, PT 08/24/2020, 12:35 PM  [facility] [address] [place], [place], [address] Phone: [phone]   Fax:  [phone]  Name: Harold Hoyt MRN: [id number] Date of Birth:  [birth year]

## 2020-08-31 ENCOUNTER — Encounter: Payer: BC Managed Care – PPO | Admitting: Physical Therapy

## 2020-09-07 ENCOUNTER — Ambulatory Visit: Payer: BC Managed Care – PPO | Admitting: Physical Therapy

## 2020-09-07 ENCOUNTER — Other Ambulatory Visit: Payer: Self-pay

## 2020-09-07 ENCOUNTER — Encounter: Payer: Self-pay | Admitting: Physical Therapy

## 2020-09-07 DIAGNOSIS — M6281 Muscle weakness (generalized): Secondary | ICD-10-CM

## 2020-09-07 DIAGNOSIS — M79605 Pain in left leg: Secondary | ICD-10-CM

## 2020-09-07 NOTE — Therapy (Signed)
PT LONG TERM GOAL #1   Title Pt will be ind with final HEP to maintain strength for functional activities and management of pain    Status On-going      PT LONG TERM GOAL #2   Title Pt will report she can stand and walk for at least 60 minutes at a time for performing normal household activities.    Baseline can stand and work more than an hour    Status Achieved      PT LONG TERM GOAL #3   Title Pt will demonstrate 5/5 hip strength for ability to lift things for household chores and walking      PT LONG TERM GOAL #4   Title Pt will report at least 60% less pain and numbness in her legs    Baseline no numbness in the leg just in the low back when bending    Status Partially Met                   Plan - 09/07/20 1556     Clinical Impression Statement Pt was able to progress core strength today.  Pt responded well to [facility] but did have some referred pain during massage to piriformis.  No pain after treatment.  Pt has significantly reduced pain overall and has met long term goal for ability to stand and  do housework.  Pt will benefit from skilled PT to address impairments and return to max function.    PT Treatment/Interventions ADLs/Self Care Home Management;Biofeedback;Cryotherapy;Electrical Stimulation;Moist Heat;Therapeutic activities;Therapeutic exercise;Neuromuscular re-education;Patient/family education;Manual techniques;Dry needling;Passive range of motion;Taping    PT Next Visit Plan progress core strength, hip hinge, gluteal STM if needed; ball rolling to glutes for self massage    PT Home Exercise Plan Access Code: [email or web address]    Consulted and Agree with Plan of Care Patient             Patient will benefit from skilled therapeutic intervention in order to improve the following deficits and impairments:  Decreased strength, Decreased coordination, Pain, Postural dysfunction, Abnormal gait  Visit Diagnosis: Muscle weakness (generalized)  Pain in left leg     Problem List Patient Active Problem List   Diagnosis Date Noted   Presence of IUD 05/03/2020   Vitamin D deficiency 07/07/2019   Prior c-section x 2  01/11/2019    Camillo Flaming Breeann Reposa, PT 09/07/2020, 4:01 PM  [facility] [address] [place], [place], [address] Phone: [phone]   Fax:  [phone]  Name: Braylen Denunzio MRN: [id number] Date of Birth: 1985-07-19  [facility] [address], [place], [place], [address] Phone: [phone]   Fax:  [phone]  Physical Therapy Treatment  Patient Details  Name: Meagan Lee MRN: [id number] Date of Birth: October 07, 1985 Referring Provider (PT): Gabriel Carina, [place]   Encounter Date: 09/07/2020   PT End of Session - 09/07/20 1426     Visit Number 4    Date for PT Re-Evaluation 09/25/20    PT Start Time 1242   late   PT Stop Time 1771    PT Time Calculation (min) 31 min    Activity Tolerance Patient tolerated treatment well    Behavior During Therapy [facility] for tasks assessed/performed             Past Medical History:  Diagnosis Date   Hypertension    New daily persistent headache 07/07/2019   Other fatigue 07/07/2019   Preeclampsia, severe 02/17/2020   Preeclampsia, third trimester 02/14/2020   Dx 02/14/20 _0  wks   Pregnancy induced hypertension    Supervision of high risk pregnancy, antepartum 12/08/2019    Nursing Staff Provider Office Location  [place] Dating  LMP  Language  Arabic Anatomy US  Normal> EDC change. FU in 4 weeks to confirm new due date.  Flu Vaccine  declined Genetic Screen  NIPS: Low Risk, female    AFP:  Too late   TDaP Vaccine   12/23/19 Hgb A1C or  GTT Early  Third trimester Normal   Glucose, Fasting 65 - 91 mg/dL 77   Glucose, 1 hour 65 - 179 mg/dL 171   Glucose, 2 hour 65 - 152    Past Surgical History:  Procedure Laterality Date   CESAREAN SECTION     x2   CESAREAN SECTION N/A 02/19/2020   Procedure: CESAREAN SECTION;  Surgeon: Aletha Halim, MD;  Location: [place] LD ORS;  Service: Obstetrics;  Laterality: N/A;    There were no vitals filed for this visit.   Subjective Assessment - 09/07/20 1244     Subjective Pt states overall better and 3/10 after washing dishes and all the house work is better.    Patient Stated Goals reduce pain    Currently in Pain? No/denies                                [facility] Adult PT Treatment/Exercise - 09/07/20 0001       Lumbar Exercises: Stretches   Figure 4 Stretch 2 reps;30 seconds      Manual Therapy   Manual Therapy Soft tissue mobilization    Soft tissue mobilization piriformis with addaday and gluteals addaday and manual trigger point release             educated and performed single arm reach in qped - 10x Hip rotation stretch       PT Education - 09/07/20 1348     Education Details Access Code: [email or web address]    Person(s) Educated Patient    Methods Explanation;Demonstration;Tactile cues;Verbal cues;Handout    Comprehension Verbalized understanding;Returned demonstration              PT Short Term Goals - 08/24/20 1159       PT SHORT TERM GOAL #1   Title Pt will be ind with initial HEP    Status Achieved               PT Long Term Goals - 09/07/20 1246

## 2020-09-07 NOTE — Patient Instructions (Signed)
AccesAccess Code: [email or web address] Added qped single arm reach

## 2020-09-14 ENCOUNTER — Other Ambulatory Visit: Payer: Self-pay

## 2020-09-14 ENCOUNTER — Ambulatory Visit: Payer: BC Managed Care – PPO | Admitting: Physical Therapy

## 2020-09-14 DIAGNOSIS — M6281 Muscle weakness (generalized): Secondary | ICD-10-CM

## 2020-09-14 DIAGNOSIS — M79605 Pain in left leg: Secondary | ICD-10-CM

## 2020-09-14 NOTE — Patient Instructions (Signed)
Access Code: [email or web address] URL: [email or web address] Date: 09/14/2020 Prepared by: Dwana Curd  Exercises Supine Pelvic Floor Stretch - 1 x daily - 7 x weekly - 1 sets - 3 reps - 30 sec hold Supine Hamstring Stretch - 1 x daily - 7 x weekly - 1 sets - 3 reps - 30 sec hold Supine Piriformis Stretch - 1 x daily - 7 x weekly - 1 sets - 3 reps - 30 sec hold Supine Lower Trunk Rotation - 1 x daily - 7 x weekly - 10 reps - 1 sets - 5 sec hold Supine Transversus Abdominis Bracing - Hands on Thighs - 1 x daily - 7 x weekly - 10 reps - 1 sets - 5 sec hold Hooklying Small March - 1 x daily - 7 x weekly - 10 reps - 2 sets Hooklying Clamshells with Resistance - 1 x daily - 7 x weekly - 3 sets - 10 reps Wall Slide with Posterior Pelvic Tilt - 1 x daily - 7 x weekly - 3 sets - 10 reps Hip Flexor Stretch at Edge of Bed - 1 x daily - 7 x weekly - 3 sets - 10 reps Sidelying Thoracic Rotation with Open Book - 1 x daily - 7 x weekly - 5 reps - 1 sets - 10 sec hold Supine Hip Internal and External Rotation - 1 x daily - 7 x weekly - 10 reps - 1 sets - 5 sec hold Quadruped Alternating Arm Lift - 1 x daily - 7 x weekly - 10 reps - 2 sets Standing Trunk Rotation with Resistance - 1 x daily - 7 x weekly - 3 sets - 10 reps Supine Knees to Chest with Swiss Ball - 1 x daily - 7 x weekly - 3 sets - 10 reps DNS Bug Heel Touches - 1 x daily - 7 x weekly - 3 sets - 10 reps Side Plank on Knees - 1 x daily - 7 x weekly - 3 sets - 10 reps

## 2020-09-14 NOTE — Therapy (Signed)
[facility] [address] [place], [place], [address] Phone: [phone]   Fax:  [phone]  Physical Therapy Treatment  Patient Details  Name: Railynn Vaccari MRN: [id number] Date of Birth: February 01, 1986 Referring Provider (PT): Bernerd Limbo, [place]   Encounter Date: 09/14/2020   PT End of Session - 09/14/20 1243     Visit Number 5    Date for PT Re-Evaluation 09/25/20    Authorization Type [place] MEDICAID PREPAID HEALTH PLAN - 4 visits until 6/18    PT Start Time 1239   late   PT Stop Time 1310    PT Time Calculation (min) 31 min    Activity Tolerance Patient tolerated treatment well    Behavior During Therapy [facility] for tasks assessed/performed             Past Medical History:  Diagnosis Date   Hypertension    New daily persistent headache 07/07/2019   Other fatigue 07/07/2019   Preeclampsia, severe 02/17/2020   Preeclampsia, third trimester 02/14/2020   Dx 02/14/20 @33  wks   Pregnancy induced hypertension    Supervision of high risk pregnancy, antepartum 12/08/2019    Nursing Staff Provider Office Location  [facility] Dating  LMP  Language  Arabic Anatomy US  Normal> EDC change. FU in 4 weeks to confirm new due date.  Flu Vaccine  declined Genetic Screen  NIPS: Low Risk, female    AFP:  Too late   TDaP Vaccine   12/23/19 Hgb A1C or  GTT Early  Third trimester Normal   Glucose, Fasting 65 - 91 mg/dL 77   Glucose, 1 hour 65 - 179 mg/dL 376   Glucose, 2 hour 65 - 152    Past Surgical History:  Procedure Laterality Date   CESAREAN SECTION     x2   CESAREAN SECTION N/A 02/19/2020   Procedure: CESAREAN SECTION;  Surgeon: [place] Bing, MD;  Location: [facility] LD [facility]S;  Service: Obstetrics;  Laterality: N/A;    There were no vitals filed for this visit.   Subjective Assessment - 09/14/20 1353     Subjective I feel better since last session and have not had any pain.    Currently in Pain? No/denies                                [facility] Adult PT Treatment/Exercise - 09/14/20 0001       Exercises   Exercises Other Exercises    Other Exercises  see HEP reviewed      Lumbar Exercises: Stretches   Figure 4 Stretch 2 reps;30 seconds    Other Lumbar Stretch Exercise trunk  rotation      Lumbar Exercises: Standing   Shoulder Extension Strengthening;15 reps;Theraband    Theraband Level (Shoulder Extension) Level 4 (Blue)    Other Standing Lumbar Exercises band rotation red 15x each side      Lumbar Exercises: Supine   Other Supine Lumbar Exercises ball roll and pick up with LE; UE ball overhead - 20x      Lumbar Exercises: Sidelying   Other Sidelying Lumbar Exercises plank on knees 5sec x 8                    PT Education - 09/14/20 1354     Education Details Access Code: [email or web address]    Person(s) Educated Patient    Methods Explanation;Demonstration;Tactile cues;Verbal cues;Handout  Comprehension Verbalized understanding;Returned demonstration              PT Short Term Goals - 08/24/20 1159       PT SHORT TERM GOAL #1   Title Pt will be ind with initial HEP    Status Achieved               PT Long Term Goals - 09/14/20 1350       PT LONG TERM GOAL #1   Title Pt will be ind with final HEP to maintain strength for functional activities and management of pain    Status Achieved      PT LONG TERM GOAL #2   Title Pt will report she can stand and walk for at least 60 minutes at a time for performing normal household activities.    Status Achieved      PT LONG TERM GOAL #3   Title Pt will demonstrate 5/5 hip strength for ability to lift things for household chores and walking    Status Achieved      PT LONG TERM GOAL #4   Title Pt will report at least 60% less pain and numbness in her legs    Baseline No pain or numbness    Status Achieved                   Plan - 09/14/20 1307     Clinical Impression Statement Pt has met  goals. Reviewed HEP and added exercises to continue with HEP and will discharge today    PT Treatment/Interventions ADLs/Self Care Home Management;Biofeedback;Cryotherapy;Electrical Stimulation;Moist Heat;Therapeutic activities;Therapeutic exercise;Neuromuscular re-education;Patient/family education;Manual techniques;Dry needling;Passive range of motion;Taping    PT Next Visit Plan d/c today    PT Home Exercise Plan Access Code: [email or web address]    Consulted and Agree with Plan of Care Patient             Patient will benefit from skilled therapeutic intervention in order to improve the following deficits and impairments:  Decreased strength, Decreased coordination, Pain, Postural dysfunction, Abnormal gait  Visit Diagnosis: Muscle weakness (generalized)  Pain in left leg     Problem List Patient Active Problem List   Diagnosis Date Noted   Presence of IUD 05/03/2020   Vitamin D deficiency 07/07/2019   Prior c-section x 2  01/11/2019    Brayton Caves Joy Reiger, PT 09/14/2020, 1:57 PM  [facility] [address], [address] [place], [place], [address] Phone: [phone]   Fax:  [phone]  Name: Nya Balam MRN: [id number] Date of Birth: 22-Jan-1986

## 2021-03-24 ENCOUNTER — Other Ambulatory Visit: Payer: Self-pay

## 2021-03-24 ENCOUNTER — Emergency Department (HOSPITAL_BASED_OUTPATIENT_CLINIC_OR_DEPARTMENT_OTHER): Payer: BC Managed Care – PPO

## 2021-03-24 ENCOUNTER — Encounter (HOSPITAL_BASED_OUTPATIENT_CLINIC_OR_DEPARTMENT_OTHER): Payer: Self-pay | Admitting: Emergency Medicine

## 2021-03-24 ENCOUNTER — Emergency Department (HOSPITAL_BASED_OUTPATIENT_CLINIC_OR_DEPARTMENT_OTHER)
Admission: EM | Admit: 2021-03-24 | Discharge: 2021-03-24 | Disposition: A | Discharge status: Home / Self Care | Payer: BC Managed Care – PPO | Attending: Emergency Medicine | Admitting: Emergency Medicine

## 2021-03-24 DIAGNOSIS — M25512 Pain in left shoulder: Secondary | ICD-10-CM | POA: Diagnosis not present

## 2021-03-24 DIAGNOSIS — I10 Essential (primary) hypertension: Secondary | ICD-10-CM | POA: Insufficient documentation

## 2021-03-24 DIAGNOSIS — M25572 Pain in left ankle and joints of left foot: Secondary | ICD-10-CM | POA: Insufficient documentation

## 2021-03-24 DIAGNOSIS — S161XXA Strain of muscle, fascia and tendon at neck level, initial encounter: Secondary | ICD-10-CM | POA: Insufficient documentation

## 2021-03-24 DIAGNOSIS — Y92410 Unspecified street and highway as the place of occurrence of the external cause: Secondary | ICD-10-CM | POA: Diagnosis not present

## 2021-03-24 DIAGNOSIS — R519 Headache, unspecified: Secondary | ICD-10-CM | POA: Diagnosis present

## 2021-03-24 LAB — PREGNANCY, URINE: Preg Test, Ur: NEGATIVE

## 2021-03-24 MED ORDER — METHOCARBAMOL 500 MG PO TABS
500.0000 mg | ORAL_TABLET | Freq: Once | ORAL | Status: AC
  Administered 2021-03-24: 500 mg via ORAL
  Filled 2021-03-24: qty 1

## 2021-03-24 MED ORDER — IBUPROFEN 400 MG PO TABS
600.0000 mg | ORAL_TABLET | Freq: Once | ORAL | Status: AC
  Administered 2021-03-24: 600 mg via ORAL
  Filled 2021-03-24: qty 1

## 2021-03-24 MED ORDER — METHOCARBAMOL 500 MG PO TABS: 500.0000 mg | ORAL_TABLET | Freq: Two times a day (BID) | ORAL | 0 refills | Status: AC
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
## 2021-03-24 NOTE — ED Notes (Signed)
Pt ambulated to restroom independently.

## 2021-05-08 IMAGING — US US OB < 14 WEEKS - US OB TV
1 series · 15 of 28 positions shown · non-contrast
Comparison: None

CLINICAL DATA: Evaluate viability.  Dating.

EXAM:
OBSTETRIC <14 WK US AND TRANSVAGINAL OB US
TECHNIQUE: Both transabdominal and transvaginal ultrasound examinations were
performed for complete evaluation of the gestation as well as the
maternal uterus, adnexal regions, and pelvic cul-de-sac.
Transvaginal technique was performed to assess early pregnancy.

[Series 1: us ob < 14 weeks - us ob tv · 15 of 68 slices shown]
[im 1/68]
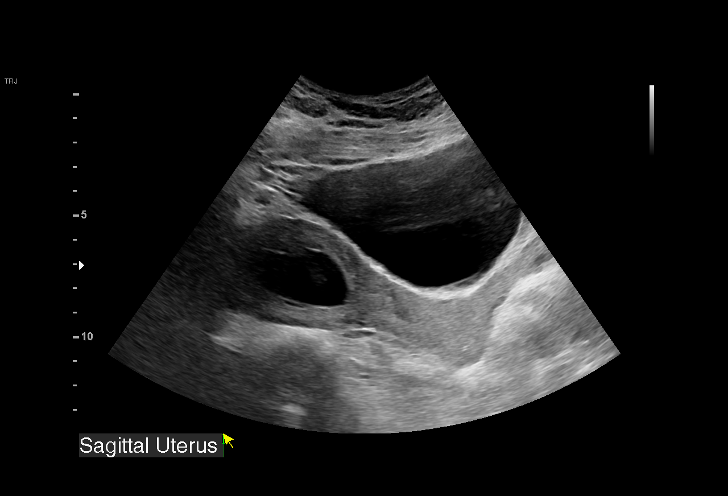
[im 5/68]
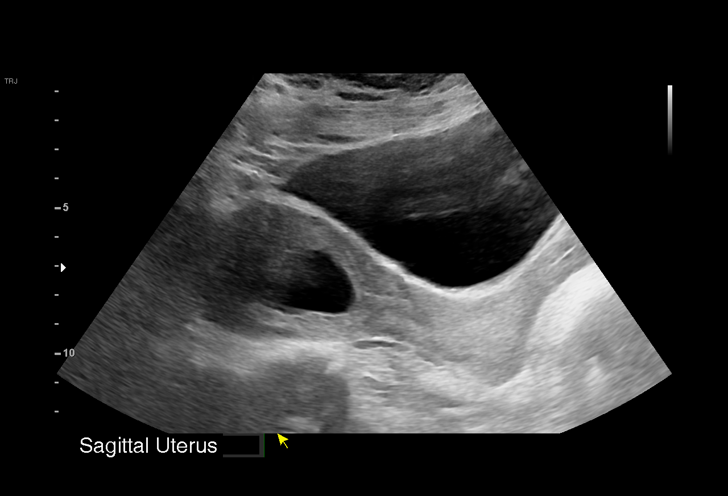
[im 10/68]
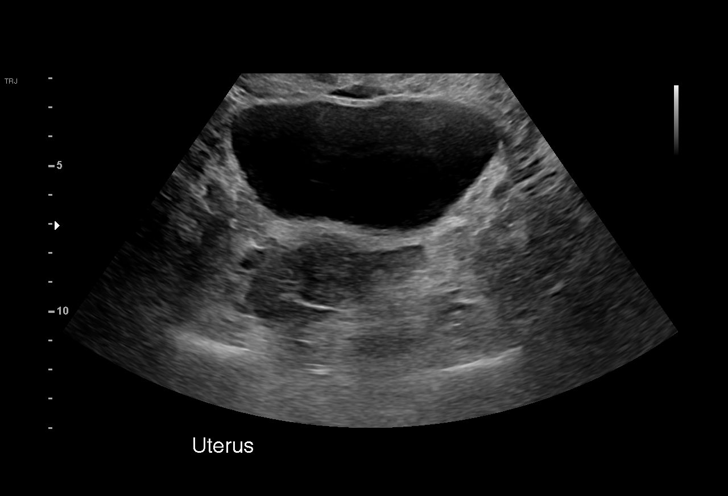
[im 15/68]
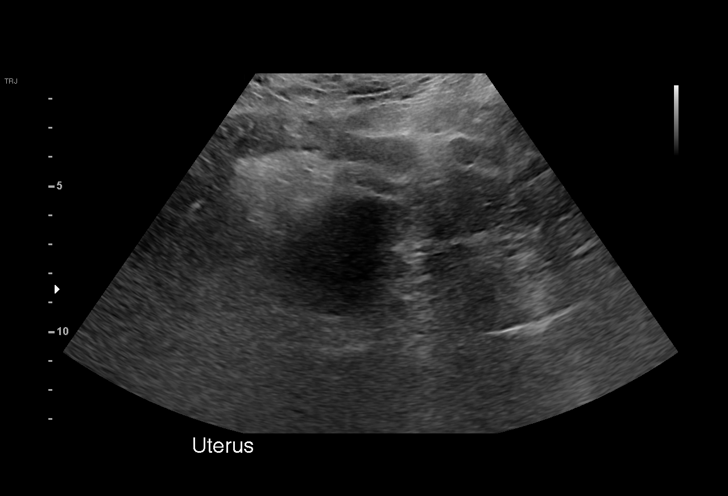
[im 20/68]
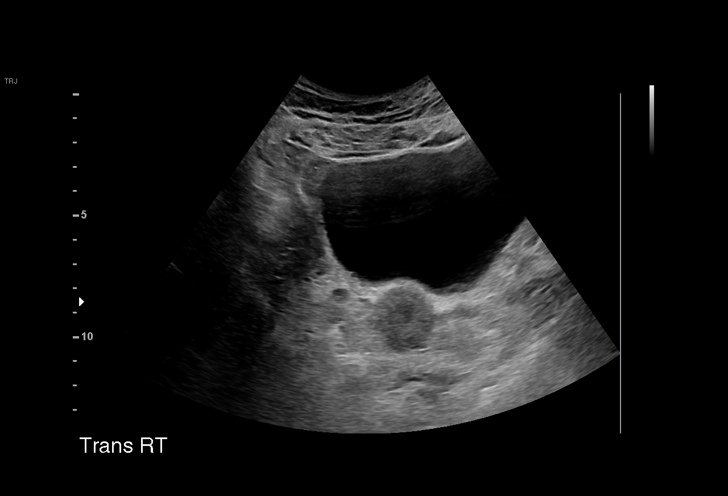
[im 25/68]
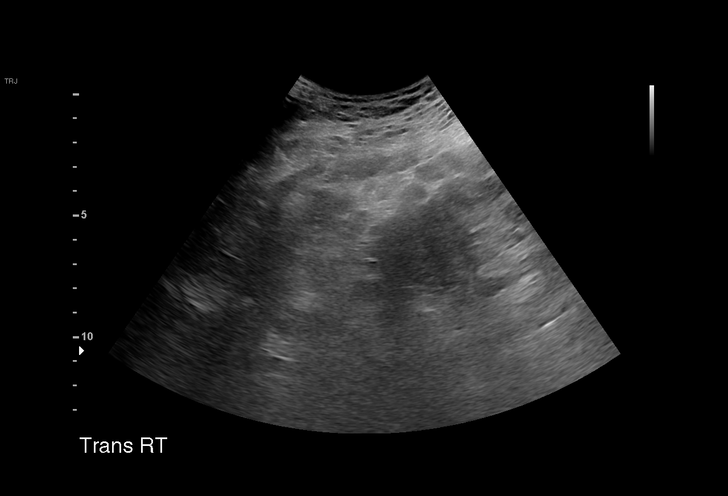
[im 30/68]
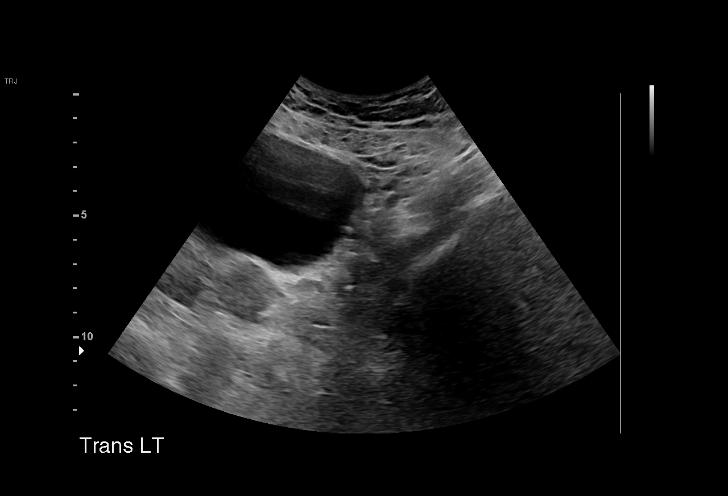
[im 35/68]
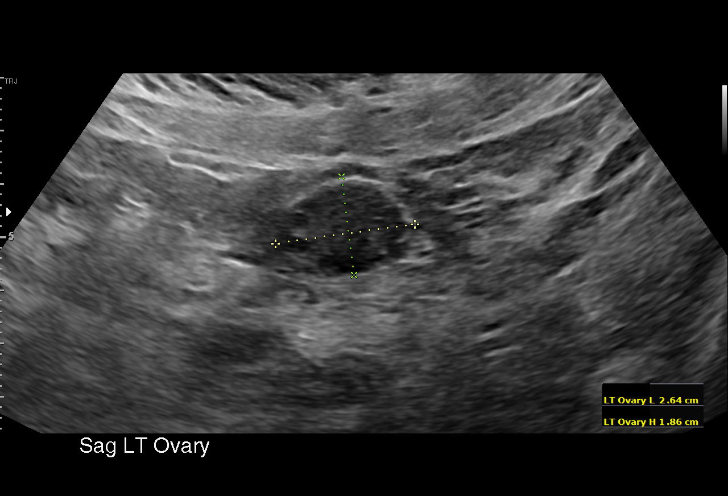
[im 38/68]
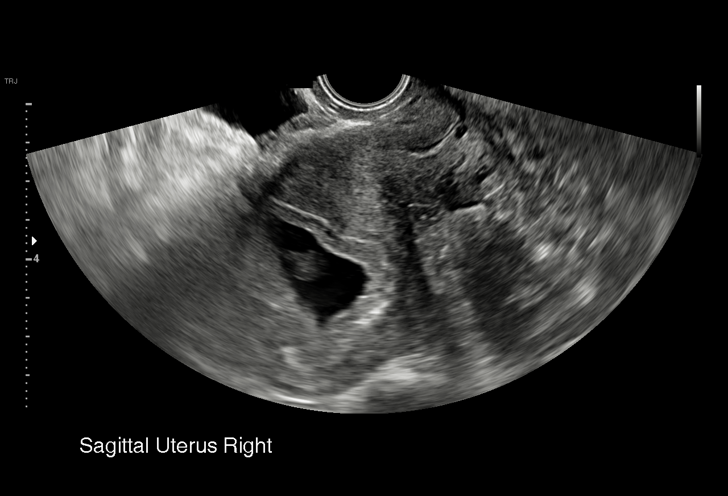
[im 43/68]
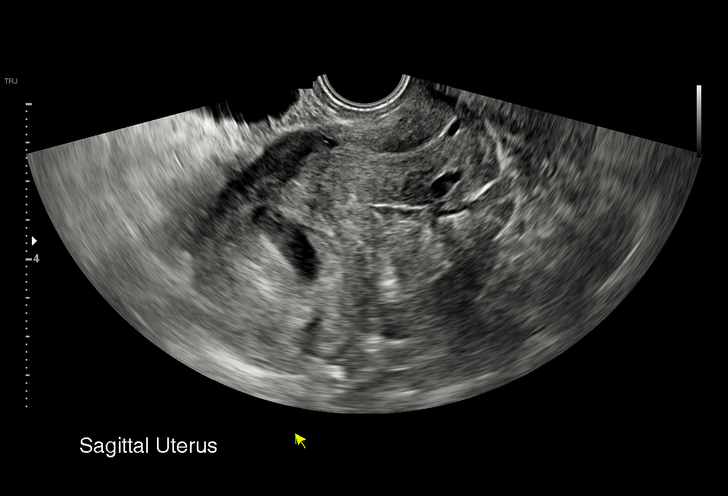
[im 48/68]
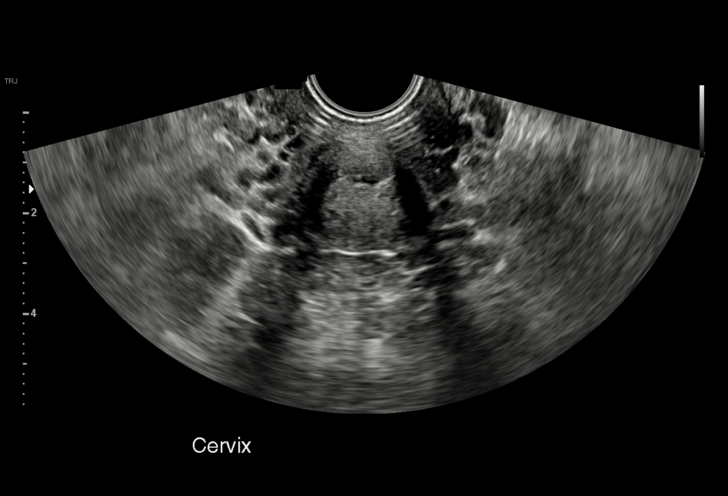
[im 53/68]
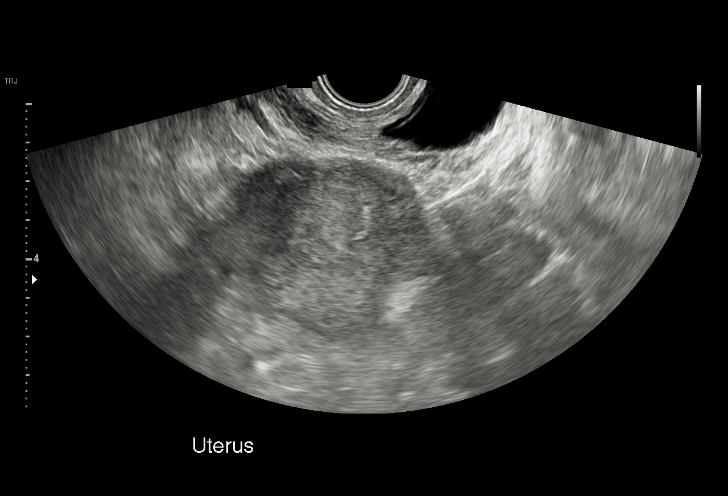
[im 58/68]
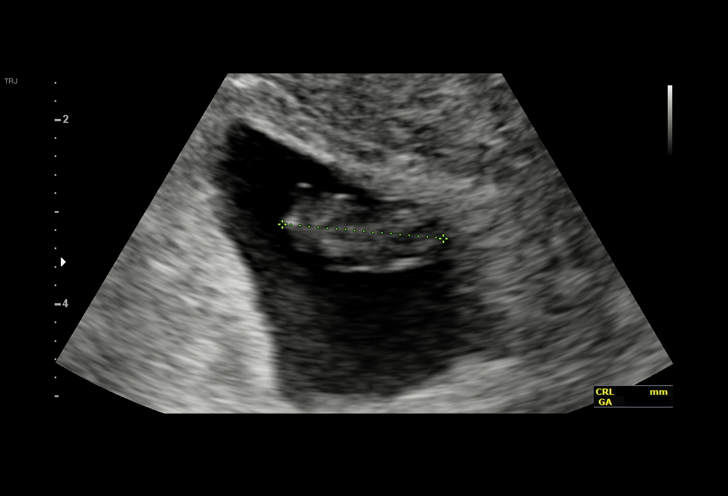
[im 63/68]
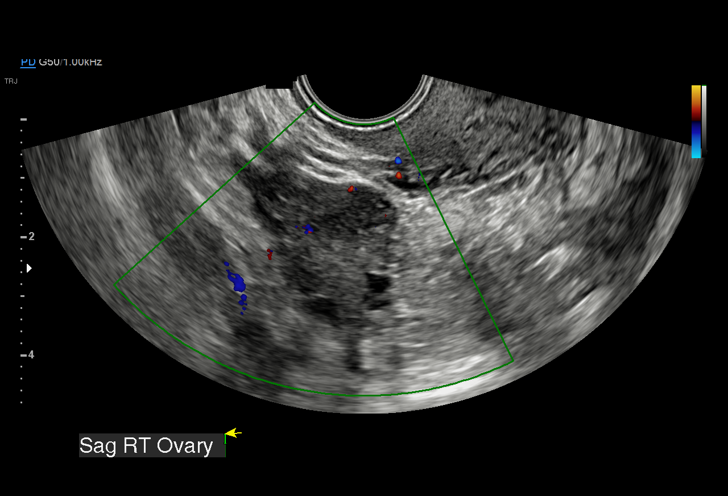
[im 68/68]
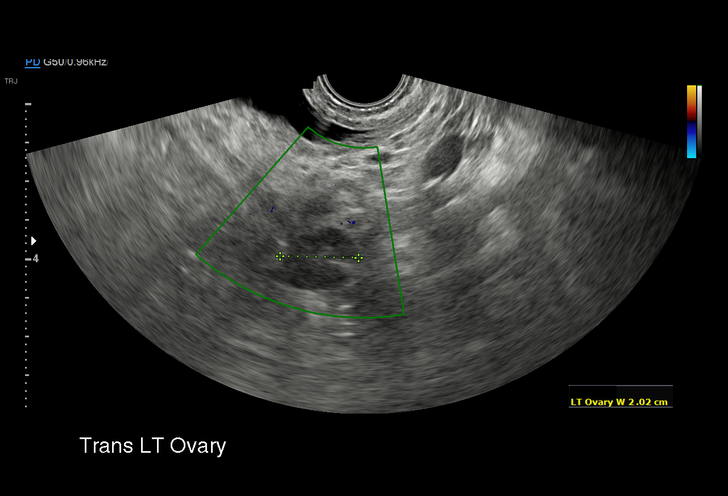

[15 of 28 positions shown; findings below may reference images not displayed]

FINDINGS: Intrauterine gestational sac: Single

Yolk sac:  No

Embryo:  Yes

Cardiac Activity: No

Heart Rate: None bpm

CRL:  17.7 mm   8 w   1 d

Subchorionic hemorrhage:  None visualized.

Maternal uterus/adnexae:

Right ovary: Normal

Left ovary: Normal

Other :None

Free fluid:  None
IMPRESSION: 1. 17.7 mm fetal pole without cardiac activity. Findings meet
definitive criteria for failed pregnancy. This follows SRU consensus
guidelines: Diagnostic Criteria for Nonviable Pregnancy Early in the
First Trimester. N Engl J Med 8434;[DATE].

## 2021-10-24 ENCOUNTER — Ambulatory Visit: Payer: BC Managed Care – PPO | Admitting: Nurse Practitioner

## 2022-04-10 ENCOUNTER — Other Ambulatory Visit (HOSPITAL_COMMUNITY): Payer: Self-pay | Admitting: Family

## 2022-04-10 DIAGNOSIS — N921 Excessive and frequent menstruation with irregular cycle: Secondary | ICD-10-CM

## 2022-04-10 DIAGNOSIS — Z30431 Encounter for routine checking of intrauterine contraceptive device: Secondary | ICD-10-CM

## 2022-04-12 ENCOUNTER — Ambulatory Visit (HOSPITAL_COMMUNITY)
Admission: RE | Admit: 2022-04-12 | Discharge: 2022-04-12 | Disposition: A | Discharge status: Home / Self Care | Payer: BC Managed Care – PPO | Source: Ambulatory Visit | Attending: Family | Admitting: Family

## 2022-04-12 DIAGNOSIS — Z30431 Encounter for routine checking of intrauterine contraceptive device: Secondary | ICD-10-CM

## 2022-04-12 DIAGNOSIS — N921 Excessive and frequent menstruation with irregular cycle: Secondary | ICD-10-CM | POA: Diagnosis present

## 2022-04-15 ENCOUNTER — Telehealth: Payer: Self-pay | Admitting: Family Medicine

## 2022-04-15 NOTE — Telephone Encounter (Signed)
Called patient to schedule appointment, there was no answer to the phone call and a voicemail box was not available. A letter was mailed.

## 2022-04-18 ENCOUNTER — Ambulatory Visit (INDEPENDENT_AMBULATORY_CARE_PROVIDER_SITE_OTHER): Payer: BC Managed Care – PPO | Admitting: Obstetrics and Gynecology

## 2022-04-18 ENCOUNTER — Other Ambulatory Visit: Payer: Self-pay

## 2022-04-18 VITALS — BP 121/72 | HR 89

## 2022-04-18 DIAGNOSIS — Z30432 Encounter for removal of intrauterine contraceptive device: Secondary | ICD-10-CM

## 2022-04-18 DIAGNOSIS — Z30018 Encounter for initial prescription of other contraceptives: Secondary | ICD-10-CM

## 2022-04-18 MED ORDER — PHEXXI 1.8-1-0.4 % VA GEL: VAGINAL | 11 refills | Status: AC

## 2022-04-18 NOTE — Patient Instructions (Addendum)
Non-hormonal options: copper IUD, birth control gel, and condoms  Birth control patch is changed weekly

## 2022-04-18 NOTE — Progress Notes (Signed)
NEW GYNECOLOGY PATIENT Patient name: Meagan Lee MRN [id number]  Date of birth: 12/28/1985 Chief Complaint:   Contraception     History:  Meagan Lee is a 37 y.o. [phone] being seen today for malpositioned IUD. Cu-IUD in place and recent shows in lower uterine segment into canal. Has been having pain and bleeding. Does not want another IUD in place - interested in non-hormonal methods if able. States after delivery was on pills and issues with her mood, particularly depressed mood. She would be interested in the patch otherwise.      Gynecologic History No LMP recorded. (Menstrual status: IUD). Contraception: IUD Last Pap: 12/2018 NILM, HPV negative Last Mammogram: n/a Last Colonoscopy: n/a  Obstetric History OB History  Gravida Para Term Preterm AB Living  4 3 2 1 1 3   SAB IAB Ectopic Multiple Live Births  1     0 3    # Outcome Date GA Lbr Len/2nd Weight Sex Delivery Anes PTL Lv  4 Preterm 02/19/20 [redacted]w[redacted]d  4 lb 1.6 oz (1.86 kg) F CS-LTranv Spinal  LIV  3 SAB 2020 [redacted]w[redacted]d         2 Term 07/04/13 [redacted]w[redacted]d   M CS-LTranv Spinal  LIV     Complications: Failure to Progress in Second Stage, Pre-eclampsia  1 Term 05/17/10 [redacted]w[redacted]d   M CS-LTranv Spinal  LIV     Complications: Pre-eclampsia, Oligohydramnios    Past Medical History:  Diagnosis Date   Hypertension    New daily persistent headache 07/07/2019   Other fatigue 07/07/2019   Preeclampsia, severe 02/17/2020   Preeclampsia, third trimester 02/14/2020   Dx 02/14/20 @33  wks   Pregnancy induced hypertension    Supervision of high risk pregnancy, antepartum 12/08/2019    Nursing Staff Provider Office Location  [place] Dating  LMP  Language  Arabic Anatomy US  Normal> EDC change. FU in 4 weeks to confirm new due date.  Flu Vaccine  declined Genetic Screen  NIPS: Low Risk, female    AFP:  Too late   TDaP Vaccine   12/23/19 Hgb A1C or  GTT Early  Third trimester Normal   Glucose, Fasting 65 - 91 mg/dL 77   Glucose, 1 hour 65 - 179 mg/dL 171    Glucose, 2 hour 65 - 152    Past Surgical History:  Procedure Laterality Date   CESAREAN SECTION     x2   CESAREAN SECTION N/A 02/19/2020   Procedure: CESAREAN SECTION;  Surgeon: Aletha Halim, MD;  Location: [facility];  Service: Obstetrics;  Laterality: N/A;    Current Outpatient Medications on File Prior to Visit  Medication Sig Dispense Refill   cholecalciferol (VITAMIN D3) 25 MCG (1000 UNIT) tablet Take 1,000 Units by mouth daily.     levothyroxine (SYNTHROID) 25 MCG tablet Take 25 mcg by mouth daily before breakfast.     No current facility-administered medications on file prior to visit.    Allergies  Allergen Reactions   Dexamethasone Anaphylaxis    Social History:  reports that she has never smoked. She has never used smokeless tobacco. She reports that she does not drink alcohol and does not use drugs.  Family History  Problem Relation Age of Onset   Hypertension Maternal Grandmother    Asthma Maternal Grandfather    Hypertension Paternal Aunt    Hypertension Paternal Uncle     The following portions of the patient's history were reviewed and updated as appropriate: allergies, current medications, past family history,  past medical history, past social history, past surgical history and problem list.  Review of Systems Pertinent items noted in HPI and remainder of comprehensive ROS otherwise negative.  Physical Exam:  BP 121/72   Pulse 89  Physical Exam Vitals and nursing note reviewed. Exam conducted with a chaperone present.  Constitutional:      Appearance: Normal appearance.  Cardiovascular:     Rate and Rhythm: Normal rate.  Pulmonary:     Effort: Pulmonary effort is normal.     Breath sounds: Normal breath sounds.  Genitourinary:    General: Normal vulva.     Exam position: Lithotomy position.     Vagina: Normal.     Cervix: Normal.     Comments: IUD strings visualized Neurological:     General: No focal deficit present.     Mental Status:  She is alert and oriented to person, place, and time.  Psychiatric:        Mood and Affect: Mood normal.        Behavior: Behavior normal.        Thought Content: Thought content normal.        Judgment: Judgment normal.    IUD Removal  Patient identified, informed consent performed, consent signed.  Patient was in the dorsal lithotomy position, normal external genitalia was noted.  A speculum was placed in the patient's vagina, small amount of dark blood was noted, no lesions. General discomfort with speculum placement - graves used. The cervix was visualized, no lesions, no abnormal discharge.  The strings of the IUD were visualized, grasped and pulled using ring forceps. The IUD was removed in its entirety. . Patient tolerated the procedure well.    Patient will use birth control gel for contraception. Routine preventative health maintenance measures emphasized.      Assessment and Plan:   1. Encounter for IUD removal Now s/p uncomplicated IUD removal.   2. Encounter for initial prescription of other contraceptives Reviewed various forms of contraception - all questions answered. Pt elected to try birth control gel. If she does not like it, would like to trial patch. No contraindications to [facility].  - Lactic Ac-Citric Ac-Pot Bitart (PHEXXI) 1.8-1-0.4 % GEL; Administer 1 applicator (5 g) intravaginally immediately before or up to 1 hour before [facility] act of vaginal intercourse as needed.  Dispense: 60 g; Refill: 11    Routine preventative health maintenance measures emphasized. Please refer to After Visit Summary for other counseling recommendations.   Follow-up: No follow-ups on file.      Darliss Cheney, MD Obstetrician & Gynecologist, Faculty [facility] for [employer], [place]

## 2023-02-27 ENCOUNTER — Encounter (HOSPITAL_BASED_OUTPATIENT_CLINIC_OR_DEPARTMENT_OTHER): Payer: Self-pay | Admitting: Emergency Medicine
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
## 2023-08-26 ENCOUNTER — Inpatient Hospital Stay (HOSPITAL_COMMUNITY)

## 2023-08-26 ENCOUNTER — Other Ambulatory Visit: Payer: Self-pay

## 2023-08-26 ENCOUNTER — Inpatient Hospital Stay (HOSPITAL_COMMUNITY)
Admission: AD | Admit: 2023-08-26 | Discharge: 2023-08-26 | Disposition: A | Discharge status: Home / Self Care | Attending: Obstetrics and Gynecology | Admitting: Obstetrics and Gynecology

## 2023-08-26 ENCOUNTER — Encounter (HOSPITAL_COMMUNITY): Payer: Self-pay | Admitting: Obstetrics and Gynecology

## 2023-08-26 DIAGNOSIS — O209 Hemorrhage in early pregnancy, unspecified: Secondary | ICD-10-CM | POA: Insufficient documentation

## 2023-08-26 DIAGNOSIS — Z3A01 Less than 8 weeks gestation of pregnancy: Secondary | ICD-10-CM

## 2023-08-26 DIAGNOSIS — O020 Blighted ovum and nonhydatidiform mole: Secondary | ICD-10-CM

## 2023-08-26 DIAGNOSIS — O3680X0 Pregnancy with inconclusive fetal viability, not applicable or unspecified: Secondary | ICD-10-CM | POA: Insufficient documentation

## 2023-08-26 DIAGNOSIS — Z113 Encounter for screening for infections with a predominantly sexual mode of transmission: Secondary | ICD-10-CM | POA: Diagnosis present

## 2023-08-26 LAB — WET PREP, GENITAL
Clue Cells Wet Prep HPF POC: NONE SEEN
Sperm: NONE SEEN
Trich, Wet Prep: NONE SEEN
WBC, Wet Prep HPF POC: <10 (ref <10)
Yeast Wet Prep HPF POC: NONE SEEN

## 2023-08-26 LAB — CBC
HCT: 37.8 % (ref 36.0–46.0)
Hemoglobin: 12.2 g/dL (ref 12.0–15.0)
MCH: 27.6 pg (ref 26.0–34.0)
MCHC: 32.3 g/dL (ref 30.0–36.0)
MCV: 85.5 fL (ref 80.0–100.0)
Platelets: 291 10*3/uL (ref 150–400)
RBC: 4.42 MIL/uL (ref 3.87–5.11)
RDW: 13.2 % (ref 11.5–15.5)
WBC: 7.8 10*3/uL (ref 4.0–10.5)
nRBC: 0 % (ref 0.0–0.2)

## 2023-08-26 LAB — URINALYSIS, ROUTINE W REFLEX MICROSCOPIC
Bilirubin Urine: NEGATIVE
Glucose, UA: NEGATIVE mg/dL
Ketones, ur: NEGATIVE mg/dL
Leukocytes,Ua: NEGATIVE
Nitrite: NEGATIVE
Protein, ur: NEGATIVE mg/dL
Specific Gravity, Urine: 1.015 (ref 1.005–1.030)
pH: 5 (ref 5.0–8.0)

## 2023-08-26 LAB — HCG, QUANTITATIVE, PREGNANCY: hCG, Beta Chain, Quant, S: 1683 m[IU]/mL — ABNORMAL HIGH (ref <5)

## 2023-08-26 NOTE — MAU Note (Signed)
 Meagan Lee is a 38 y.o. at Unknown here in MAU reporting: she having "light" brown/red VB that began yesterday.  States she is also having period type cramping.  Reports last intercourse was 4 days ago.  LMP: 07/07/2023 Onset of complaint: yesterday Pain score: 2 Vitals:   08/26/23 1322  BP: (!) 125/59  Pulse: 81  Resp: 20  Temp: 98.2 F (36.8 C)  SpO2: 100%     FHT: NA  Lab orders placed from triage: UA

## 2023-08-26 NOTE — MAU Provider Note (Signed)
 S [name] is a 38 y.o. [phone] patient who presents to MAU today with complaint of that she has has some dark brown/red vaginal bleeding that started yesterday with some abdominal cramping.  Positive urine  pregnancy test  on 08/13/23. Denies active red vaginal bleeding.     O BP (!) 125/59 (BP Location: Right Arm)   Pulse 81   Temp 98.2 F (36.8 C) (Oral)   Resp 20   Ht 5\' 4"  (1.626 m)   Wt 89.9 kg   LMP 07/07/2023   SpO2 100%   BMI 34.00 kg/m  Physical Exam Vitals and nursing note reviewed.  Constitutional:      General: She is not in acute distress.    Appearance: Normal appearance. She is obese. She is not ill-appearing.  HENT:     Head: Normocephalic.     Nose: Nose normal.     Mouth/Throat:     Mouth: Mucous membranes are moist.  Cardiovascular:     Rate and Rhythm: Normal rate.  Pulmonary:     Effort: Pulmonary effort is normal.  Abdominal:     Palpations: Abdomen is soft.     Tenderness: There is no abdominal tenderness.  Musculoskeletal:        General: Normal range of motion.     Cervical back: Normal range of motion.  Skin:    General: Skin is warm.  Neurological:     Mental Status: She is alert and oriented to person, place, and time.  Psychiatric:        Mood and Affect: Mood normal.        Behavior: Behavior normal.     MDM  HIGH  Vaginal bleeding in early pregnacy CBC: unremarkable  HCG Quant: 1,683 ABO: O Positive per [facility] OB Ultrasound: [redacted] week gestational sac visualized Swabs : negative UA: large blood otherwise unremarkable , no evidence of UTI   Differential diagnosis considered for 1st trimester vaginal bleeding includes but is not limited to: ectopic pregnancy, complete spontaneous abortion, incomplete abortion, missed abortion, threatened abortion, embryonic/fetal demise, cervical insufficiency, cervical or vaginal disorder    Orders Placed This Encounter  Procedures   Wet prep, genital    Standing Status:   Standing     Number of Occurrences:   1   US  OB LESS THAN 14 WEEKS WITH OB TRANSVAGINAL    Standing Status:   Standing    Number of Occurrences:   1    Symptom/Reason for Exam:   Vaginal bleeding affecting early pregnancy [[id number]]   Urinalysis, Routine w reflex microscopic -Urine, Clean Catch    Standing Status:   Standing    Number of Occurrences:   1    Specimen Source:   Urine, Clean Catch [[id number]]   CBC    Standing Status:   Standing    Number of Occurrences:   1   hCG, quantitative, pregnancy    Standing Status:   Standing    Number of Occurrences:   1   Discharge patient Discharge disposition: 01-Home or Self Care; Discharge patient date: 08/26/2023    Standing Status:   Standing    Number of Occurrences:   1    Discharge disposition:   01-Home or Self Care [1]    Discharge patient date:   08/26/2023      Results for orders placed or performed during the hospital encounter of 08/26/23 (from the past 24 hours)  Urinalysis, Routine w reflex microscopic -Urine, Clean Catch  Status: Abnormal   Collection Time: 08/26/23  1:24 PM  Result Value Ref Range   Color, Urine YELLOW YELLOW   APPearance HAZY (A) CLEAR   Specific Gravity, Urine 1.015 1.005 - 1.030   pH 5.0 5.0 - 8.0   Glucose, UA NEGATIVE NEGATIVE mg/dL   Hgb urine dipstick LARGE (A) NEGATIVE   Bilirubin Urine NEGATIVE NEGATIVE   Ketones, ur NEGATIVE NEGATIVE mg/dL   Protein, ur NEGATIVE NEGATIVE mg/dL   Nitrite NEGATIVE NEGATIVE   Leukocytes,Ua NEGATIVE NEGATIVE   RBC / HPF 0-5 0 - 5 RBC/hpf   WBC, UA 0-5 0 - 5 WBC/hpf   Bacteria, UA RARE (A) NONE SEEN   Squamous Epithelial / HPF 0-5 0 - 5 /HPF   Mucus PRESENT   CBC     Status: None   Collection Time: 08/26/23  2:01 PM  Result Value Ref Range   WBC 7.8 4.0 - 10.5 K/uL   RBC 4.42 3.87 - 5.11 MIL/uL   Hemoglobin 12.2 12.0 - 15.0 g/dL   HCT 16.1 09.6 - 04.5 %   MCV 85.5 80.0 - 100.0 fL   MCH 27.6 26.0 - 34.0 pg   MCHC 32.3 30.0 - 36.0 g/dL   RDW 40.9 81.1 - 91.4 %    Platelets 291 150 - 400 K/uL   nRBC 0.0 0.0 - 0.2 %  hCG, quantitative, pregnancy     Status: Abnormal   Collection Time: 08/26/23  2:01 PM  Result Value Ref Range   hCG, Beta Chain, Quant, S 1,683 (H) <5 mIU/mL  Wet prep, genital     Status: None   Collection Time: 08/26/23  2:43 PM   Specimen: PATH Cytology Cervicovaginal Ancillary Only  Result Value Ref Range   Yeast Wet Prep HPF POC NONE SEEN NONE SEEN   Trich, Wet Prep NONE SEEN NONE SEEN   Clue Cells Wet Prep HPF POC NONE SEEN NONE SEEN   WBC, Wet Prep HPF POC <10 <10   Sperm NONE SEEN      Narrative & Impression  CLINICAL DATA:  Vaginal bleeding for 2 days   EXAM: OBSTETRIC <14 WK ULTRASOUND   TECHNIQUE: Transabdominal ultrasound was performed for evaluation of the gestation as well as the maternal uterus and adnexal regions.   COMPARISON:  None Available.   FINDINGS: Intrauterine gestational sac: Single   Yolk sac:  No   Embryo:  No   Cardiac Activity: No   Heart Rate: 0 bpm   MSD:  7.7 mm mm   5 w   for d   CRL:     mm    w  d                  US  EDC:   Subchorionic hemorrhage:  None visualized.   Maternal uterus/adnexae:   IMPRESSION: Early slightly flattened and/or deformed, gestational sac without evidence of fetal pole or cardiac activity. Correlation with beta HCG recommended follow-up ultrasound suggested.   Correlate clinically. Correlation with beta HCG recommended. Follow-up suggested as clinically needed.     Electronically Signed   By: Fredrich Jefferson M.D.   On: 08/26/2023 15:24     I have reviewed the patient chart and performed the physical exam . I have ordered & interpreted the lab results and reviewed them with the patient   A/P as described below.  Counseling and education provided and patient agreeable  with plan as described below. Verbalized understanding.    ASSESSMENT Medical screening exam  complete  1. Vaginal bleeding affecting early pregnancy (Primary)  2. [redacted] weeks  gestation of pregnancy  3. Empty gestational sac with ongoing pregnancy    PLAN Future Appointments  Date Time Provider Department Center  09/04/2023  8:15 AM [facility] US2 [facility]    Discharge from MAU in stable condition  Follow up viability ultrasound scheduled   See AVS for full description of educational information and instructions provided to the patient at time of discharge  List of options for follow-up given   Warning signs for worsening condition that would warrant emergency follow-up discussed  Patient may return to MAU as needed   Cherlynn Cornfield, NP 08/26/2023 4:29 PM

## 2023-08-27 LAB — GC/CHLAMYDIA PROBE AMP (~~LOC~~) NOT AT ARMC
Chlamydia: NEGATIVE
Comment: NEGATIVE
Comment: NORMAL
Neisseria Gonorrhea: NEGATIVE

## 2023-09-03 ENCOUNTER — Other Ambulatory Visit: Payer: Self-pay

## 2023-09-03 DIAGNOSIS — Z3200 Encounter for pregnancy test, result unknown: Secondary | ICD-10-CM

## 2023-09-04 ENCOUNTER — Other Ambulatory Visit: Payer: Self-pay

## 2023-09-04 ENCOUNTER — Ambulatory Visit: Admitting: Family Medicine

## 2023-09-04 ENCOUNTER — Ambulatory Visit

## 2023-09-04 ENCOUNTER — Encounter: Payer: Self-pay | Admitting: Family Medicine

## 2023-09-04 DIAGNOSIS — Z3A00 Weeks of gestation of pregnancy not specified: Secondary | ICD-10-CM | POA: Diagnosis not present

## 2023-09-04 DIAGNOSIS — O039 Complete or unspecified spontaneous abortion without complication: Secondary | ICD-10-CM

## 2023-09-04 DIAGNOSIS — Z3200 Encounter for pregnancy test, result unknown: Secondary | ICD-10-CM | POA: Diagnosis not present

## 2023-09-04 NOTE — Assessment & Plan Note (Signed)
 Ultrasound images personally reviewed. IUGS is questionably present in lower position in endometrial canal, upper portion of endomtrium with thin stripe. Patient also reporting to me no longer feeling pregnancy symptoms and had a negative home UPT. Overall picture consistent with SAB.  Reassured patient that miscarriage is common with ~1/4 of women experiencing it in their lifetime. Reassured patient that there is nothing she did or did not do to cause this. Reviewed most common reason is presumed to be genetic abnormalities that allow a pregnancy to start but not continue past an early stage, but realistically we do not know the cause in most cases. Reviewed that studies show no definite difference between attempting another pregnancy sooner vs waiting, though some studies do show better live birth outcomes with trying sooner. Recommended we get beta hcg given still technically pregnancy of unknown location and that we would need to trend it if still positive.

## 2023-09-04 NOTE — Progress Notes (Unsigned)
 Pt advised no bleeding nor pain

## 2023-09-04 NOTE — Progress Notes (Signed)
 GYNECOLOGY OFFICE VISIT NOTE  History:   Meagan Lee is a 38 y.o. [id number] here today for US  results.  Seen in MAU on 08/26/2023, at that time reporting bleeding Hcg was 1683, US  showed possible irregularly shaped gestational sac, appt made for f/u  Today reports she has not had any bleeding for two days No abdominal pain Reports she does not have early pregnancy symptoms of nausea anymore She also took a home UPT a few days ago which was negative  Health Maintenance Due  Topic Date Due   HPV VACCINES (1 - 3-dose SCDM series) Never done   COVID-19 Vaccine (2 - 2024-25 season) 11/17/2022    Past Medical History:  Diagnosis Date   Hypertension    New daily persistent headache 07/07/2019   Other fatigue 07/07/2019   Preeclampsia, severe 02/17/2020   Preeclampsia, third trimester 02/14/2020   Dx 02/14/20 @33  wks   Pregnancy induced hypertension    Supervision of high risk pregnancy, antepartum 12/08/2019    Nursing Staff Provider Office Location  [facility] Dating  LMP  Language  Arabic Anatomy US   Normal> EDC change. FU in 4 weeks to confirm new due date.  Flu Vaccine  declined Genetic Screen  NIPS: Low Risk, female    AFP:  Too late   TDaP Vaccine   12/23/19 Hgb A1C or  GTT Early  Third trimester Normal   Glucose, Fasting 65 - 91 mg/dL 77   Glucose, 1 hour 65 - 179 mg/dL 454   Glucose, 2 hour 65 - 152    Past Surgical History:  Procedure Laterality Date   CESAREAN SECTION     x2   CESAREAN SECTION N/A 02/19/2020   Procedure: CESAREAN SECTION;  Surgeon: Raynell Caller, MD;  Location: [facility] ORS;  Service: Obstetrics;  Laterality: N/A;    The following portions of the patient's history were reviewed and updated as appropriate: allergies, current medications, past family history, past medical history, past social history, past surgical history and problem list.   Health Maintenance:   Last pap: Result Date Procedure Results Follow-ups  01/11/2019 Cytology - PAP( [facility]) High  risk HPV: Negative Adequacy: Satisfactory for evaluation; transformation zone component ABSENT. Diagnosis: - Negative for intraepithelial lesion or malignancy (NILM) Comment: Normal Reference Range HPV - Negative      Last mammogram:  N/a    Review of Systems:  Pertinent items noted in HPI and remainder of comprehensive ROS otherwise negative.  Physical Exam:  LMP 07/07/2023  Physical Exam Constitutional:      General: She is not in acute distress.    Appearance: Normal appearance. She is not ill-appearing.  HENT:     Head: Atraumatic.   Eyes:     General: No scleral icterus.    Conjunctiva/sclera: Conjunctivae normal.   Pulmonary:     Effort: Pulmonary effort is normal.   Skin:    General: Skin is warm and dry.     Coloration: Skin is not jaundiced or pale.   Neurological:     Mental Status: She is alert.     Coordination: Coordination normal.   Psychiatric:        Mood and Affect: Mood normal.        Behavior: Behavior normal.      Labs and Imaging No results found for this or any previous visit (from the past week). US  OB LESS THAN 14 WEEKS WITH OB TRANSVAGINAL Result Date: 08/26/2023 CLINICAL DATA:  Vaginal bleeding for 2 days  EXAM: OBSTETRIC <14 WK ULTRASOUND TECHNIQUE: Transabdominal ultrasound was performed for evaluation of the gestation as well as the maternal uterus and adnexal regions. COMPARISON:  None Available. FINDINGS: Intrauterine gestational sac: Single Yolk sac:  No Embryo:  No Cardiac Activity: No Heart Rate: 0 bpm MSD:  7.7 mm mm   5 w   for d CRL:     mm    w  d                  US  EDC: Subchorionic hemorrhage:  None visualized. Maternal uterus/adnexae: IMPRESSION: Early slightly flattened and/or deformed, gestational sac without evidence of fetal pole or cardiac activity. Correlation with beta HCG recommended follow-up ultrasound suggested. Correlate clinically. Correlation with beta HCG recommended. Follow-up suggested as clinically needed.  Electronically Signed   By: Fredrich Jefferson M.D.   On: 08/26/2023 15:24      Assessment and Plan:   Problem List Items Addressed This Visit       Other   SAB (spontaneous abortion) - Primary   Ultrasound images personally reviewed. IUGS is questionably present in lower position in endometrial canal, upper portion of endomtrium with thin stripe. Patient also reporting to me no longer feeling pregnancy symptoms and had a negative home UPT. Overall picture consistent with SAB.  Reassured patient that miscarriage is common with ~1/4 of women experiencing it in their lifetime. Reassured patient that there is nothing she did or did not do to cause this. Reviewed most common reason is presumed to be genetic abnormalities that allow a pregnancy to start but not continue past an early stage, but realistically we do not know the cause in most cases. Reviewed that studies show no definite difference between attempting another pregnancy sooner vs waiting, though some studies do show better live birth outcomes with trying sooner. Recommended we get beta hcg given still technically pregnancy of unknown location and that we would need to trend it if still positive.       Relevant Orders   Beta hCG quant (ref lab)    Routine preventative health maintenance measures emphasized. Please refer to After Visit Summary for other counseling recommendations.   Return for pending results.    Total face-to-face time with patient: 15 minutes.  Over 50% of encounter was spent on counseling and coordination of care.   Teena Feast, MD/MPH Attending Family Medicine Physician, [facility] for [facility], [facility]

## 2023-09-05 ENCOUNTER — Ambulatory Visit: Payer: Self-pay | Admitting: Family Medicine

## 2023-09-05 LAB — BETA HCG QUANT (REF LAB): hCG Quant: 7 m[IU]/mL

## 2023-09-08 NOTE — Telephone Encounter (Addendum)
 Attempted to call patient x2 regarding recent lab appointment. Left VM for patient to call our office back regarding recent appointment she had in office.  Rosaline, RN ----- Message from Donnice CHRISTELLA Carolus sent at 09/05/2023 12:07 PM EDT ----- Downtrending appropriately in setting presumed SAB Clinical staff please coordinate follow up hcg in one week, trend to normal range ----- Message ----- From: Interface, [facility] Lab Results In Sent: 09/05/2023   7:37 AM EDT To: Donnice CHRISTELLA Carolus, MD

## 2023-09-10 NOTE — Telephone Encounter (Signed)
-----   Message from Rosaline Pendleton, RN sent at 09/08/2023 10:15 AM EDT -----   ----- Message ----- From: Lola Donnice HERO, MD Sent: 09/05/2023  12:07 PM EDT To: [facility] Clinical Pool  Downtrending appropriately in setting presumed SAB Clinical staff please coordinate follow up hcg in one week, trend to normal range ----- Message ----- From: Interface, [facility] Lab Results In Sent: 09/05/2023   7:37 AM EDT To: Donnice HERO Lola, MD

## 2023-09-10 NOTE — Telephone Encounter (Signed)
 3RD attempt to contact Pt. With [employer] id # [id number] to go over Beta results & to advise to get blood draw in a week to re-check, no answer, left VM.

## 2023-09-11 ENCOUNTER — Other Ambulatory Visit: Payer: Self-pay

## 2023-09-11 DIAGNOSIS — O039 Complete or unspecified spontaneous abortion without complication: Secondary | ICD-10-CM

## 2023-09-11 NOTE — Telephone Encounter (Signed)
 Called patient regarding physician's advised for repeat HCG levels in one week to trend to 0. Patient scheduled for HCG lab visit on 09/12/23 at 10:30 AM. Patient confirmed scheduled appointment.  Rosaline, RN

## 2023-09-12 ENCOUNTER — Other Ambulatory Visit

## 2023-09-12 ENCOUNTER — Other Ambulatory Visit: Payer: Self-pay

## 2023-09-12 DIAGNOSIS — O039 Complete or unspecified spontaneous abortion without complication: Secondary | ICD-10-CM

## 2023-09-13 LAB — BETA HCG QUANT (REF LAB): hCG Quant: <1 m[IU]/mL
